# Patient Record
Sex: Female | Born: 1980 | Race: White | Hispanic: Yes | Marital: Single
Health system: Southern US, Community
[De-identification: ages and names within clinical notes are randomized; demographics above are authoritative.]

## PROBLEM LIST (undated history)

## (undated) ENCOUNTER — Inpatient Hospital Stay (HOSPITAL_COMMUNITY): Payer: Self-pay

## (undated) DIAGNOSIS — F329 Major depressive disorder, single episode, unspecified: Secondary | ICD-10-CM

## (undated) DIAGNOSIS — Z789 Other specified health status: Secondary | ICD-10-CM

## (undated) DIAGNOSIS — O0930 Supervision of pregnancy with insufficient antenatal care, unspecified trimester: Secondary | ICD-10-CM

## (undated) DIAGNOSIS — F32A Depression, unspecified: Secondary | ICD-10-CM

## (undated) DIAGNOSIS — S0991XA Unspecified injury of ear, initial encounter: Secondary | ICD-10-CM

## (undated) DIAGNOSIS — Z758 Other problems related to medical facilities and other health care: Secondary | ICD-10-CM

## (undated) DIAGNOSIS — Z8619 Personal history of other infectious and parasitic diseases: Secondary | ICD-10-CM

## (undated) DIAGNOSIS — R87629 Unspecified abnormal cytological findings in specimens from vagina: Secondary | ICD-10-CM

## (undated) DIAGNOSIS — K219 Gastro-esophageal reflux disease without esophagitis: Secondary | ICD-10-CM

## (undated) DIAGNOSIS — E669 Obesity, unspecified: Secondary | ICD-10-CM

## (undated) DIAGNOSIS — D649 Anemia, unspecified: Secondary | ICD-10-CM

## (undated) DIAGNOSIS — Z9289 Personal history of other medical treatment: Secondary | ICD-10-CM

## (undated) HISTORY — PX: GYNECOLOGIC CRYOSURGERY: SHX857

## (undated) HISTORY — DX: Personal history of other infectious and parasitic diseases: Z86.19

## (undated) HISTORY — DX: Unspecified injury of ear, initial encounter: S09.91XA

## (undated) HISTORY — DX: Gastro-esophageal reflux disease without esophagitis: K21.9

## (undated) HISTORY — PX: APPENDECTOMY: SHX54

## (undated) HISTORY — DX: Anemia, unspecified: D64.9

## (undated) HISTORY — DX: Supervision of pregnancy with insufficient antenatal care, unspecified trimester: O09.30

## (undated) HISTORY — DX: Other problems related to medical facilities and other health care: Z75.8

## (undated) HISTORY — DX: Other specified health status: Z78.9

## (undated) HISTORY — DX: Personal history of other medical treatment: Z92.89

## (undated) HISTORY — DX: Unspecified abnormal cytological findings in specimens from vagina: R87.629

## (undated) HISTORY — DX: Obesity, unspecified: E66.9

---

## 2001-08-22 HISTORY — PX: OTHER SURGICAL HISTORY: SHX169

## 2002-08-22 HISTORY — PX: COLPOSCOPY: SHX161

## 2005-08-22 DIAGNOSIS — Z9289 Personal history of other medical treatment: Secondary | ICD-10-CM

## 2005-08-22 HISTORY — DX: Personal history of other medical treatment: Z92.89

## 2007-09-12 ENCOUNTER — Inpatient Hospital Stay (HOSPITAL_COMMUNITY): Admission: AD | Admit: 2007-09-12 | Discharge: 2007-09-12 | Payer: Self-pay | Admitting: Obstetrics & Gynecology

## 2008-02-29 ENCOUNTER — Emergency Department (HOSPITAL_COMMUNITY): Admission: EM | Admit: 2008-02-29 | Discharge: 2008-02-29 | Payer: Self-pay | Admitting: Emergency Medicine

## 2009-01-06 ENCOUNTER — Emergency Department (HOSPITAL_COMMUNITY): Admission: EM | Admit: 2009-01-06 | Discharge: 2009-01-06 | Payer: Self-pay | Admitting: Emergency Medicine

## 2009-08-26 ENCOUNTER — Emergency Department (HOSPITAL_BASED_OUTPATIENT_CLINIC_OR_DEPARTMENT_OTHER): Admission: EM | Admit: 2009-08-26 | Discharge: 2009-08-26 | Payer: Self-pay | Admitting: Emergency Medicine

## 2010-05-05 ENCOUNTER — Inpatient Hospital Stay (HOSPITAL_COMMUNITY): Admission: AD | Admit: 2010-05-05 | Discharge: 2010-05-05 | Payer: Self-pay | Admitting: Obstetrics & Gynecology

## 2010-05-05 ENCOUNTER — Ambulatory Visit: Payer: Self-pay | Admitting: Nurse Practitioner

## 2010-06-18 ENCOUNTER — Ambulatory Visit (HOSPITAL_COMMUNITY): Admission: RE | Admit: 2010-06-18 | Discharge: 2010-06-18 | Payer: Self-pay | Admitting: Family Medicine

## 2010-09-27 ENCOUNTER — Other Ambulatory Visit: Payer: Self-pay | Admitting: Family Medicine

## 2010-09-27 ENCOUNTER — Inpatient Hospital Stay (HOSPITAL_COMMUNITY): Admission: AD | Admit: 2010-09-27 | Payer: Self-pay | Admitting: Obstetrics & Gynecology

## 2010-09-27 DIAGNOSIS — O480 Post-term pregnancy: Secondary | ICD-10-CM

## 2010-09-29 ENCOUNTER — Inpatient Hospital Stay (HOSPITAL_COMMUNITY)
Admission: AD | Admit: 2010-09-29 | Discharge: 2010-10-02 | DRG: 775 | Disposition: A | Discharge status: Home / Self Care | Payer: Medicaid Other | Source: Ambulatory Visit | Attending: Obstetrics & Gynecology | Admitting: Obstetrics & Gynecology

## 2010-09-29 DIAGNOSIS — O34219 Maternal care for unspecified type scar from previous cesarean delivery: Principal | ICD-10-CM | POA: Diagnosis present

## 2010-09-29 LAB — CBC
Platelets: 268 10*3/uL (ref 150–400)
RBC: 3.73 MIL/uL — ABNORMAL LOW (ref 3.87–5.11)
RDW: 14.1 % (ref 11.5–15.5)
WBC: 7.3 10*3/uL (ref 4.0–10.5)

## 2010-09-29 LAB — RPR: RPR Ser Ql: NONREACTIVE

## 2010-09-30 DIAGNOSIS — O34219 Maternal care for unspecified type scar from previous cesarean delivery: Secondary | ICD-10-CM

## 2010-10-01 ENCOUNTER — Ambulatory Visit (HOSPITAL_COMMUNITY): Payer: Medicaid Other | Attending: Family Medicine

## 2010-11-04 LAB — WET PREP, GENITAL

## 2010-11-04 LAB — URINALYSIS, ROUTINE W REFLEX MICROSCOPIC
Bilirubin Urine: NEGATIVE
Nitrite: NEGATIVE
Protein, ur: NEGATIVE mg/dL
Specific Gravity, Urine: 1.025 (ref 1.005–1.030)
Urobilinogen, UA: 0.2 mg/dL (ref 0.0–1.0)

## 2010-11-04 LAB — CBC
HCT: 29.9 % — ABNORMAL LOW (ref 36.0–46.0)
Hemoglobin: 10.5 g/dL — ABNORMAL LOW (ref 12.0–15.0)
MCHC: 35.1 g/dL (ref 30.0–36.0)
RBC: 3.25 MIL/uL — ABNORMAL LOW (ref 3.87–5.11)

## 2010-11-04 LAB — DIFFERENTIAL
Basophils Relative: 1 % (ref 0–1)
Lymphs Abs: 1.9 10*3/uL (ref 0.7–4.0)
Monocytes Absolute: 0.5 10*3/uL (ref 0.1–1.0)
Monocytes Relative: 6 % (ref 3–12)
Neutro Abs: 5.8 10*3/uL (ref 1.7–7.7)
Neutrophils Relative %: 69 % (ref 43–77)

## 2010-11-04 LAB — ABO/RH: ABO/RH(D): O POS

## 2010-11-04 LAB — GC/CHLAMYDIA PROBE AMP, GENITAL
Chlamydia, DNA Probe: NEGATIVE
GC Probe Amp, Genital: NEGATIVE

## 2010-11-04 LAB — RUBELLA SCREEN: Rubella: <0.2 IU/mL

## 2010-11-04 LAB — HIV ANTIBODY (ROUTINE TESTING W REFLEX): HIV: NONREACTIVE

## 2010-11-04 LAB — RPR: RPR Ser Ql: NONREACTIVE

## 2010-11-07 LAB — DIFFERENTIAL
Basophils Absolute: 0.1 10*3/uL (ref 0.0–0.1)
Basophils Relative: 1 % (ref 0–1)
Eosinophils Relative: 1 % (ref 0–5)
Lymphocytes Relative: 12 % (ref 12–46)
Monocytes Absolute: 0.7 10*3/uL (ref 0.1–1.0)

## 2010-11-07 LAB — COMPREHENSIVE METABOLIC PANEL
ALT: 26 U/L (ref 0–35)
AST: 29 U/L (ref 0–37)
Albumin: 5.4 g/dL — ABNORMAL HIGH (ref 3.5–5.2)
Alkaline Phosphatase: 72 U/L (ref 39–117)
Sodium: 143 mEq/L (ref 135–145)
Total Bilirubin: 0.8 mg/dL (ref 0.3–1.2)

## 2010-11-07 LAB — CBC
HCT: 47.1 % — ABNORMAL HIGH (ref 36.0–46.0)
Hemoglobin: 16.2 g/dL — ABNORMAL HIGH (ref 12.0–15.0)
MCHC: 34.3 g/dL (ref 30.0–36.0)
MCV: 89.8 fL (ref 78.0–100.0)
RBC: 5.25 MIL/uL — ABNORMAL HIGH (ref 3.87–5.11)
RDW: 12.2 % (ref 11.5–15.5)

## 2010-11-07 LAB — URINALYSIS, ROUTINE W REFLEX MICROSCOPIC
Glucose, UA: NEGATIVE mg/dL
Hgb urine dipstick: NEGATIVE
Protein, ur: NEGATIVE mg/dL

## 2010-11-30 LAB — URINALYSIS, ROUTINE W REFLEX MICROSCOPIC
Ketones, ur: 15 mg/dL — AB
Nitrite: NEGATIVE
Protein, ur: NEGATIVE mg/dL
Urobilinogen, UA: 1 mg/dL (ref 0.0–1.0)

## 2010-11-30 LAB — URINE MICROSCOPIC-ADD ON

## 2010-11-30 LAB — RAPID STREP SCREEN (MED CTR MEBANE ONLY): Streptococcus, Group A Screen (Direct): POSITIVE — AB

## 2010-11-30 LAB — WET PREP, GENITAL: Trich, Wet Prep: NONE SEEN

## 2010-11-30 LAB — GC/CHLAMYDIA PROBE AMP, GENITAL: Chlamydia, DNA Probe: NEGATIVE

## 2011-05-13 LAB — WET PREP, GENITAL
Clue Cells Wet Prep HPF POC: NONE SEEN
Trich, Wet Prep: NONE SEEN

## 2011-05-13 LAB — POCT PREGNANCY, URINE
Operator id: 113551
Preg Test, Ur: POSITIVE

## 2011-05-13 LAB — URINALYSIS, ROUTINE W REFLEX MICROSCOPIC
Bilirubin Urine: NEGATIVE
Hgb urine dipstick: NEGATIVE
Ketones, ur: NEGATIVE
Specific Gravity, Urine: 1.015
Urobilinogen, UA: 0.2

## 2011-05-13 LAB — CBC
HCT: 31.6 — ABNORMAL LOW
Hemoglobin: 11 — ABNORMAL LOW
MCHC: 34.7
MCV: 88.8
Platelets: 333
RBC: 3.57 — ABNORMAL LOW
RDW: 12.9
WBC: 8.2

## 2011-05-13 LAB — GC/CHLAMYDIA PROBE AMP, GENITAL
Chlamydia, DNA Probe: NEGATIVE
GC Probe Amp, Genital: NEGATIVE

## 2011-05-17 ENCOUNTER — Inpatient Hospital Stay (HOSPITAL_COMMUNITY)
Admission: AD | Admit: 2011-05-17 | Discharge: 2011-05-17 | Disposition: A | Discharge status: Home / Self Care | Payer: Self-pay | Source: Ambulatory Visit | Attending: Obstetrics & Gynecology | Admitting: Obstetrics & Gynecology

## 2011-05-17 ENCOUNTER — Encounter (HOSPITAL_COMMUNITY): Payer: Self-pay | Admitting: *Deleted

## 2011-05-17 DIAGNOSIS — O368190 Decreased fetal movements, unspecified trimester, not applicable or unspecified: Secondary | ICD-10-CM | POA: Insufficient documentation

## 2011-05-17 NOTE — Progress Notes (Signed)
Pt states she was sent up from clinic to hear heart beat due to no fetal movement.  Has appt scheduled Oct. 10th in low risk clinic.  Denies any bleeding, discharge, or leaking of fluid.  Has not started feeling baby move.

## 2011-05-17 NOTE — ED Provider Notes (Signed)
History   Pt presents today c/o no fetal movement. Pt is currently 19.5wks. She states she called the OB clinic today to schedule an appt and when they found out she was not feeling fetal movement, she was told to come to the MAU. She denies vag dc, bleeding, pain, or any other sx at this time.  No chief complaint on file.  HPI  OB History    Grav Para Term Preterm Abortions TAB SAB Ect Mult Living   1               No past medical history on file.  No past surgical history on file.  No family history on file.  History  Substance Use Topics  . Smoking status: Not on file  . Smokeless tobacco: Not on file  . Alcohol Use: Not on file    Allergies: Allergies not on file  No prescriptions prior to admission    Review of Systems  Constitutional: Negative for fever.  Cardiovascular: Negative for chest pain.  Gastrointestinal: Negative for nausea, vomiting, abdominal pain, diarrhea and constipation.  Genitourinary: Negative for dysuria, urgency, frequency and hematuria.  Neurological: Negative for dizziness and headaches.  Psychiatric/Behavioral: Negative for depression and suicidal ideas.   Physical Exam   Blood pressure 111/65, pulse 73, resp. rate 18, height 5\' 3"  (1.6 m), weight 159 lb 9.6 oz (72.394 kg).  Physical Exam  Constitutional: She is oriented to person, place, and time. She appears well-developed and well-nourished. No distress.  HENT:  Head: Normocephalic and atraumatic.  Eyes: EOM are normal. Pupils are equal, round, and reactive to light.  GI: Soft. She exhibits no distension. There is no tenderness. There is no rebound and no guarding.  Genitourinary: No bleeding around the vagina. No vaginal discharge found.  Neurological: She is alert and oriented to person, place, and time.  Skin: Skin is warm and dry. She is not diaphoretic.  Psychiatric: She has a normal mood and affect. Her behavior is normal. Judgment and thought content normal.    MAU Course    Procedures  FHTs 160s.  Assessment and Plan  Decreased fetal movement: discussed with pt at length. She understands that she is not expected to feel much movement if any at 19.5wks. She has appt scheduled. Discussed diet, activity, risks, and precautions.  Clinton Gallant. Rice III, DrHSc, MPAS, PA-C  05/17/2011, 3:33 PM   Henrietta Hoover, PA 05/17/11 1536

## 2011-05-17 NOTE — ED Provider Notes (Signed)
Attestation of Attending Supervision of Advanced Practitioner: Evaluation and management procedures were performed by the PA/NP/CNM/OB Fellow under my supervision/collaboration. Chart reviewed and agree with management and plan.  Fedra Lanter A 05/17/2011 7:53 PM

## 2011-05-19 LAB — URINALYSIS, ROUTINE W REFLEX MICROSCOPIC
Bilirubin Urine: NEGATIVE
Hgb urine dipstick: NEGATIVE
Protein, ur: NEGATIVE
Urobilinogen, UA: 1

## 2011-05-19 LAB — WET PREP, GENITAL: Trich, Wet Prep: NONE SEEN

## 2011-05-19 LAB — CBC
HCT: 39.7
Hemoglobin: 13.2
WBC: 10

## 2011-05-19 LAB — DIFFERENTIAL
Eosinophils Relative: 2
Lymphocytes Relative: 27
Lymphs Abs: 2.7
Monocytes Absolute: 0.7

## 2011-05-19 LAB — POCT PREGNANCY, URINE
Operator id: 161631
Preg Test, Ur: NEGATIVE

## 2011-05-19 LAB — BASIC METABOLIC PANEL
GFR calc Af Amer: >60
GFR calc non Af Amer: >60
Potassium: 3.7
Sodium: 137

## 2011-05-19 LAB — GC/CHLAMYDIA PROBE AMP, GENITAL: GC Probe Amp, Genital: NEGATIVE

## 2011-06-01 ENCOUNTER — Ambulatory Visit (INDEPENDENT_AMBULATORY_CARE_PROVIDER_SITE_OTHER): Payer: Self-pay | Admitting: Obstetrics and Gynecology

## 2011-06-01 DIAGNOSIS — K8020 Calculus of gallbladder without cholecystitis without obstruction: Secondary | ICD-10-CM | POA: Insufficient documentation

## 2011-06-01 DIAGNOSIS — Z23 Encounter for immunization: Secondary | ICD-10-CM

## 2011-06-01 DIAGNOSIS — Z3490 Encounter for supervision of normal pregnancy, unspecified, unspecified trimester: Secondary | ICD-10-CM

## 2011-06-01 DIAGNOSIS — Z3480 Encounter for supervision of other normal pregnancy, unspecified trimester: Secondary | ICD-10-CM

## 2011-06-01 LAB — POCT URINALYSIS DIP (DEVICE)
Glucose, UA: NEGATIVE mg/dL
Hgb urine dipstick: NEGATIVE
Specific Gravity, Urine: 1.025 (ref 1.005–1.030)
Urobilinogen, UA: 0.2 mg/dL (ref 0.0–1.0)

## 2011-06-01 MED ORDER — INFLUENZA VIRUS VACC SPLIT PF IM SUSP: 0.5000 mL | Freq: Once | INTRAMUSCULAR | Status: DC

## 2011-06-01 NOTE — Progress Notes (Signed)
Patient is 30 year old Spanish-speaking Hispanic female gravida 3 para 200 to. Her first baby was born by cesarean section for fetal distress. Her successful VBAC with her second baby in desires the same with this will. She presents at the first time at 21 weeks 4 days gestation. The only complaint she has of significance is bilateral numbness in her arms and hands when she drives a car or sleeps. In her hands she complains of swollen. Her pregnancy thus far has been uneventful. She's been taking prenatal vitamins for several months. Her last Pap smear was normal 2-3 years ago. Her youngest baby is 27 months old.  Examination: HEENT within normal limits. Neck supple with normal size symmetrical thyroid. Lungs are clear to auscultation and percussion. Heart no murmur normal sinus rhythm. Abdomen soft with fundus palpable 22 cm above the symphysis pubis. No tenderness guarding or rebound. No significant upper right quadrant pain. Extremities: No edema, no varicosities, DTRs within normal limits. Pelvic: External genitalia normal. Introitus marital, BUS within normal limits. Vagina clean and well rugated with no significant discharge. Cervix long closed and well epithelialized. Pap smear was taken will also be checked for GC and Chlamydia.  Impression normal mid trimester pregnancy.  Plan: Prenatal profile. Flu shot. Return in 4 weeks for routine exam.

## 2011-06-01 NOTE — Progress Notes (Signed)
Edema: trace on hands with c/o numbness Pain: pelvic Pressure: pelvic

## 2011-06-02 LAB — OBSTETRIC PANEL
Antibody Screen: NEGATIVE
Eosinophils Absolute: 0.1 10*3/uL (ref 0.0–0.7)
Eosinophils Relative: 1 % (ref 0–5)
HCT: 35.5 % — ABNORMAL LOW (ref 36.0–46.0)
Hemoglobin: 11.4 g/dL — ABNORMAL LOW (ref 12.0–15.0)
Lymphs Abs: 1.8 10*3/uL (ref 0.7–4.0)
MCH: 29.3 pg (ref 26.0–34.0)
MCV: 91.3 fL (ref 78.0–100.0)
Monocytes Absolute: 0.6 10*3/uL (ref 0.1–1.0)
Monocytes Relative: 7 % (ref 3–12)
Platelets: 310 10*3/uL (ref 150–400)
RBC: 3.89 MIL/uL (ref 3.87–5.11)
Rh Type: POSITIVE

## 2011-06-02 LAB — HIV ANTIBODY (ROUTINE TESTING W REFLEX): HIV: NONREACTIVE

## 2011-06-06 ENCOUNTER — Other Ambulatory Visit: Payer: Self-pay | Admitting: Family Medicine

## 2011-06-06 DIAGNOSIS — Z3689 Encounter for other specified antenatal screening: Secondary | ICD-10-CM

## 2011-06-06 LAB — ANTIBODY SCREEN: Antibody Screen: NEGATIVE

## 2011-06-06 LAB — ABO/RH: RH Type: POSITIVE

## 2011-06-06 LAB — RPR: RPR: NONREACTIVE

## 2011-06-06 LAB — GC/CHLAMYDIA PROBE AMP, GENITAL
Chlamydia: NEGATIVE
Chlamydia: NEGATIVE
Gonorrhea: NEGATIVE

## 2011-06-09 ENCOUNTER — Ambulatory Visit (HOSPITAL_COMMUNITY)
Admission: RE | Admit: 2011-06-09 | Discharge: 2011-06-09 | Disposition: A | Discharge status: Home / Self Care | Payer: Self-pay | Source: Ambulatory Visit | Attending: Family Medicine | Admitting: Family Medicine

## 2011-06-09 DIAGNOSIS — Z3689 Encounter for other specified antenatal screening: Secondary | ICD-10-CM

## 2011-06-09 DIAGNOSIS — Z1389 Encounter for screening for other disorder: Secondary | ICD-10-CM | POA: Insufficient documentation

## 2011-06-09 DIAGNOSIS — Z363 Encounter for antenatal screening for malformations: Secondary | ICD-10-CM | POA: Insufficient documentation

## 2011-06-09 DIAGNOSIS — O358XX0 Maternal care for other (suspected) fetal abnormality and damage, not applicable or unspecified: Secondary | ICD-10-CM | POA: Insufficient documentation

## 2011-07-13 ENCOUNTER — Encounter (HOSPITAL_COMMUNITY): Payer: Self-pay

## 2011-07-13 ENCOUNTER — Emergency Department (HOSPITAL_COMMUNITY)
Admission: EM | Admit: 2011-07-13 | Discharge: 2011-07-13 | Disposition: A | Discharge status: Home / Self Care | Payer: Medicaid Other | Attending: Emergency Medicine | Admitting: Emergency Medicine

## 2011-07-13 DIAGNOSIS — O99891 Other specified diseases and conditions complicating pregnancy: Secondary | ICD-10-CM | POA: Insufficient documentation

## 2011-07-13 DIAGNOSIS — R1110 Vomiting, unspecified: Secondary | ICD-10-CM | POA: Insufficient documentation

## 2011-07-13 DIAGNOSIS — R112 Nausea with vomiting, unspecified: Secondary | ICD-10-CM | POA: Insufficient documentation

## 2011-07-13 DIAGNOSIS — R109 Unspecified abdominal pain: Secondary | ICD-10-CM | POA: Insufficient documentation

## 2011-07-13 DIAGNOSIS — R8271 Bacteriuria: Secondary | ICD-10-CM

## 2011-07-13 DIAGNOSIS — J45909 Unspecified asthma, uncomplicated: Secondary | ICD-10-CM | POA: Insufficient documentation

## 2011-07-13 DIAGNOSIS — Z79899 Other long term (current) drug therapy: Secondary | ICD-10-CM | POA: Insufficient documentation

## 2011-07-13 DIAGNOSIS — R10813 Right lower quadrant abdominal tenderness: Secondary | ICD-10-CM | POA: Insufficient documentation

## 2011-07-13 LAB — URINALYSIS, ROUTINE W REFLEX MICROSCOPIC
Hgb urine dipstick: NEGATIVE
Ketones, ur: NEGATIVE mg/dL
Protein, ur: NEGATIVE mg/dL
Urobilinogen, UA: 1 mg/dL (ref 0.0–1.0)

## 2011-07-13 LAB — URINE MICROSCOPIC-ADD ON

## 2011-07-13 MED ORDER — NITROFURANTOIN MACROCRYSTAL 100 MG PO CAPS: 100.0000 mg | ORAL_CAPSULE | Freq: Two times a day (BID) | ORAL | Status: AC

## 2011-07-13 MED ORDER — NITROFURANTOIN MACROCRYSTAL 100 MG PO CAPS
100.0000 mg | ORAL_CAPSULE | Freq: Four times a day (QID) | ORAL | Status: DC
  Administered 2011-07-13: 100 mg via ORAL
  Filled 2011-07-13 (×4): qty 1

## 2011-07-13 NOTE — ED Notes (Signed)
Pt presents with vomiting and abdominal pain x 2 days.  Pt reports symptoms began yesterday after biting into a biscuit at [employer] and finding a tooth.  Pt denies the tooth is hers, denies any dental pain.  Pt is [redacted] weeks pregnant.

## 2011-07-13 NOTE — ED Notes (Signed)
Patient states one day ago eating and found a tooth while chewing.  Spit out food then felt nausea and emesis and epigastric pain radiating to ruq and rlq.  Today pain achy 3/10. States 27 months pregnant and feels baby moving.  Patient airway intact bilateral equal chest rise and fall. No apparent distress noted.

## 2011-07-14 NOTE — ED Provider Notes (Signed)
History     CSN: [id number] Arrival date & time: 07/13/2011 12:02 PM   First MD Initiated Contact with Patient 07/13/11 1314      Chief Complaint  Patient presents with  . Emesis    (Consider location/radiation/quality/duration/timing/severity/associated sxs/prior treatment) HPI Patient is a 30 yo G3P2 F at [redacted] weeks gestation who presents with complaint of RLQ, RUQ abdominal pain aas well as nausea and vomiting.  She has had no fevers, urinary symptoms, constipation ,diarrhea, vaginal discharge of any sort.  Patient feels the baby moving and has noted no change with this.  She was seen by her OB yesterday.  She had some of these symptoms at that time and was told she was fine.  She had a UA there but does not know what the results were.  She has already had an appendectomy.  The patient is mainly here because she ate a biscuit yesterday at [employer] and reported that she found a tooth in her biscuit.  She did not lose a tooth.  When she told the restaurant staff they stated that no one there had lost a tooth and were otherwise unconcerned.  PAtient's boyfriend wanted her to get checked out for this. Past Medical History  Diagnosis Date  . No pertinent past medical history   . Asthma     Past Surgical History  Procedure Date  . Cesarean section   . Appendectomy     History reviewed. No pertinent family history.  History  Substance Use Topics  . Smoking status: Never Smoker   . Smokeless tobacco: Not on file  . Alcohol Use: No    OB History    Grav Para Term Preterm Abortions TAB SAB Ect Mult Living   3 2 2       2       Review of Systems  Unable to perform ROS Constitutional: Negative.   HENT: Negative.   Eyes: Negative.   Respiratory: Negative.   Cardiovascular: Negative.   Gastrointestinal:       See HPI  Genitourinary: Negative.   Musculoskeletal: Negative.   Neurological: Negative.   Hematological: Negative.   Psychiatric/Behavioral: Negative.   All other  systems reviewed and are negative.    Allergies  Review of patient's allergies indicates no known allergies.  Home Medications   Current Outpatient Rx  Name Route Sig Dispense Refill  . CALCIUM CARBONATE ANTACID 500 MG PO CHEW Oral Chew 2 tablets by mouth 3 (three) times daily. indigestion     . PRENATAL 27-0.8 MG PO TABS Oral Take 1 tablet by mouth daily.      Marland Kitchen NITROFURANTOIN MACROCRYSTAL 100 MG PO CAPS Oral Take 1 capsule (100 mg total) by mouth 2 (two) times daily. 20 capsule 0    BP 111/62  Pulse 79  Temp(Src) 98 F (36.7 C) (Oral)  Resp 20  Ht 5\' 3"  (1.6 m)  Wt 166 lb (75.297 kg)  BMI 29.41 kg/m2  SpO2 97%  Physical Exam  Nursing note and vitals reviewed. Constitutional: She is oriented to person, place, and time. She appears well-developed and well-nourished. No distress.  HENT:  Head: Normocephalic and atraumatic.  Eyes: Conjunctivae and EOM are normal. Pupils are equal, round, and reactive to light.  Neck: Normal range of motion.  Cardiovascular: Normal rate, regular rhythm, normal heart sounds and intact distal pulses.  Exam reveals no gallop and no friction rub.   No murmur heard. Pulmonary/Chest: Effort normal and breath sounds normal. No respiratory distress. She has  no wheezes. She has no rales.  Abdominal: Soft. Bowel sounds are normal. There is no rebound and no guarding.       Gravid and c/w dates, minimal TTP on deep palpation in the RLQ. No TTP elsewhere despite patient report.  Genitourinary:       Pelvic exam deferred in stretcher triage, FHT's checked and in the 150s, Patient denied any vaginal discharge.  Musculoskeletal: Normal range of motion.  Neurological: She is alert and oriented to person, place, and time. No cranial nerve deficit. She exhibits normal muscle tone. Coordination normal.  Skin: Skin is warm and dry. No rash noted.  Psychiatric: She has a normal mood and affect.    ED Course  Procedures (including critical care time)  Labs  Reviewed  URINALYSIS, ROUTINE W REFLEX MICROSCOPIC - Abnormal; Notable for the following:    Leukocytes, UA SMALL (*)    All other components within normal limits  URINE MICROSCOPIC-ADD ON - Abnormal; Notable for the following:    Squamous Epithelial / LPF FEW (*)    Bacteria, UA FEW (*)    All other components within normal limits  URINE CULTURE   No results found.   1. Bacteriuria, asymptomatic in pregnancy       MDM  Patient was very well appearing and had only minimal TTP over the RLQ.  She was mainly concerned about the possible ingestion of a tooth.  I explained that if this had occurred it should just pass in her stool.  There was no concern for aspiration of this,  She did have mild TTP in the RLQ but she no longer has an appendix.  She also has no vaginal discharge at all and was seen by her OB yesterday.  Baby's movement has been normal and FHT were WNL.  Patient had UA with asymptomatic bacteriuria and was given macrodantin for this.  She was discharged in good condition.        Cyndra Numbers, MD 07/14/11 1009

## 2011-07-15 LAB — URINE CULTURE: Colony Count: >=100000

## 2011-07-16 NOTE — ED Notes (Signed)
+   urine culture. Treated with Macrodantin, sensitive to same per protocol MD.

## 2011-07-27 LAB — STREP B DNA PROBE: GBS: POSITIVE

## 2011-07-28 LAB — STREP B DNA PROBE: GBS: POSITIVE

## 2011-08-23 NOTE — L&D Delivery Note (Signed)
Delivery Note At 7:22 PM a viable female was delivered via Vaginal, Spontaneous Delivery (Presentation: ;ROA  ).  APGAR: 9, 9; weight 7 lb 0.7 oz (3195 g).   Placenta status: Intact, Spontaneous.  Cord: 3 vessels with the following complications: None.   Anesthesia: None  Episiotomy: None Lacerations: None Suture Repair: n/a Est. Blood Loss (mL):   Mom to postpartum.  Baby to nursery-stable.  CRESENZO-DISHMAN,Dhanya Bogle 10/12/2011, 8:22 PM

## 2011-10-06 ENCOUNTER — Other Ambulatory Visit: Payer: Self-pay | Admitting: Family Medicine

## 2011-10-06 DIAGNOSIS — O480 Post-term pregnancy: Secondary | ICD-10-CM

## 2011-10-07 ENCOUNTER — Telehealth (HOSPITAL_COMMUNITY): Payer: Self-pay | Admitting: *Deleted

## 2011-10-07 ENCOUNTER — Encounter (HOSPITAL_COMMUNITY): Payer: Self-pay | Admitting: *Deleted

## 2011-10-07 NOTE — Telephone Encounter (Signed)
Preadmission screen  

## 2011-10-10 ENCOUNTER — Other Ambulatory Visit (HOSPITAL_COMMUNITY): Payer: Self-pay | Admitting: Physician Assistant

## 2011-10-10 ENCOUNTER — Ambulatory Visit (HOSPITAL_COMMUNITY): Admission: RE | Admit: 2011-10-10 | Payer: Self-pay | Source: Ambulatory Visit

## 2011-10-10 ENCOUNTER — Encounter (HOSPITAL_COMMUNITY): Payer: Self-pay | Admitting: *Deleted

## 2011-10-10 ENCOUNTER — Ambulatory Visit (HOSPITAL_COMMUNITY)
Admission: RE | Admit: 2011-10-10 | Discharge: 2011-10-10 | Disposition: A | Discharge status: Home / Self Care | Payer: Self-pay | Source: Ambulatory Visit | Attending: Physician Assistant | Admitting: Physician Assistant

## 2011-10-10 DIAGNOSIS — O480 Post-term pregnancy: Secondary | ICD-10-CM

## 2011-10-10 DIAGNOSIS — Z3689 Encounter for other specified antenatal screening: Secondary | ICD-10-CM | POA: Insufficient documentation

## 2011-10-10 NOTE — Telephone Encounter (Signed)
[id number] 

## 2011-10-12 ENCOUNTER — Inpatient Hospital Stay (HOSPITAL_COMMUNITY)
Admission: AD | Admit: 2011-10-12 | Discharge: 2011-10-14 | DRG: 775 | Disposition: A | Discharge status: Home / Self Care | Payer: Medicaid Other | Source: Ambulatory Visit | Attending: Family Medicine | Admitting: Family Medicine

## 2011-10-12 ENCOUNTER — Encounter (HOSPITAL_COMMUNITY): Payer: Self-pay

## 2011-10-12 DIAGNOSIS — O9989 Other specified diseases and conditions complicating pregnancy, childbirth and the puerperium: Secondary | ICD-10-CM

## 2011-10-12 DIAGNOSIS — Z22330 Carrier of Group B streptococcus: Secondary | ICD-10-CM

## 2011-10-12 DIAGNOSIS — O99892 Other specified diseases and conditions complicating childbirth: Principal | ICD-10-CM | POA: Diagnosis present

## 2011-10-12 HISTORY — DX: Depression, unspecified: F32.A

## 2011-10-12 HISTORY — DX: Major depressive disorder, single episode, unspecified: F32.9

## 2011-10-12 LAB — CBC
HCT: 31 % — ABNORMAL LOW (ref 36.0–46.0)
MCH: 27 pg (ref 26.0–34.0)
MCV: 83.6 fL (ref 78.0–100.0)
RBC: 3.71 MIL/uL — ABNORMAL LOW (ref 3.87–5.11)
WBC: 12.1 10*3/uL — ABNORMAL HIGH (ref 4.0–10.5)

## 2011-10-12 LAB — GC/CHLAMYDIA PROBE AMP, GENITAL
Chlamydia: NEGATIVE
Gonorrhea: NEGATIVE

## 2011-10-12 LAB — ABO/RH: ABO/RH(D): O POS

## 2011-10-12 MED ORDER — OXYCODONE-ACETAMINOPHEN 5-325 MG PO TABS
1.0000 | ORAL_TABLET | ORAL | Status: DC | PRN
  Administered 2011-10-12 (×2): 1 via ORAL
  Filled 2011-10-12 (×2): qty 1

## 2011-10-12 MED ORDER — IBUPROFEN 600 MG PO TABS
600.0000 mg | ORAL_TABLET | Freq: Four times a day (QID) | ORAL | Status: DC
  Administered 2011-10-13 – 2011-10-14 (×8): 600 mg via ORAL
  Filled 2011-10-12 (×8): qty 1

## 2011-10-12 MED ORDER — WITCH HAZEL-GLYCERIN EX PADS: 1.0000 "application " | MEDICATED_PAD | CUTANEOUS | Status: DC | PRN

## 2011-10-12 MED ORDER — PENICILLIN G POTASSIUM 5000000 UNITS IJ SOLR
2.5000 10*6.[IU] | INTRAVENOUS | Status: DC
  Filled 2011-10-12 (×3): qty 2.5

## 2011-10-12 MED ORDER — BISACODYL 10 MG RE SUPP: 10.0000 mg | Freq: Every day | RECTAL | Status: DC | PRN

## 2011-10-12 MED ORDER — SENNOSIDES-DOCUSATE SODIUM 8.6-50 MG PO TABS
2.0000 | ORAL_TABLET | Freq: Every day | ORAL | Status: DC
  Administered 2011-10-13 (×2): 2 via ORAL

## 2011-10-12 MED ORDER — ONDANSETRON HCL 4 MG/2ML IJ SOLN: 4.0000 mg | INTRAMUSCULAR | Status: DC | PRN

## 2011-10-12 MED ORDER — LACTATED RINGERS IV SOLN: 500.0000 mL | INTRAVENOUS | Status: DC | PRN

## 2011-10-12 MED ORDER — OXYTOCIN BOLUS FROM INFUSION
500.0000 mL | Freq: Once | INTRAVENOUS | Status: AC
  Administered 2011-10-12: 500 mL via INTRAVENOUS
  Filled 2011-10-12: qty 1000
  Filled 2011-10-12: qty 500

## 2011-10-12 MED ORDER — ONDANSETRON HCL 4 MG/2ML IJ SOLN: 4.0000 mg | Freq: Four times a day (QID) | INTRAMUSCULAR | Status: DC | PRN

## 2011-10-12 MED ORDER — CITRIC ACID-SODIUM CITRATE 334-500 MG/5ML PO SOLN: 30.0000 mL | ORAL | Status: DC | PRN

## 2011-10-12 MED ORDER — NALBUPHINE SYRINGE 5 MG/0.5 ML
5.0000 mg | INJECTION | INTRAMUSCULAR | Status: DC | PRN
  Administered 2011-10-12: 5 mg via INTRAVENOUS
  Filled 2011-10-12 (×2): qty 0.5

## 2011-10-12 MED ORDER — METHYLERGONOVINE MALEATE 0.2 MG/ML IJ SOLN: 0.2000 mg | INTRAMUSCULAR | Status: DC | PRN

## 2011-10-12 MED ORDER — DIPHENHYDRAMINE HCL 25 MG PO CAPS: 25.0000 mg | ORAL_CAPSULE | Freq: Four times a day (QID) | ORAL | Status: DC | PRN

## 2011-10-12 MED ORDER — METHYLERGONOVINE MALEATE 0.2 MG PO TABS: 0.2000 mg | ORAL_TABLET | ORAL | Status: DC | PRN

## 2011-10-12 MED ORDER — TETANUS-DIPHTH-ACELL PERTUSSIS 5-2.5-18.5 LF-MCG/0.5 IM SUSP
0.5000 mL | Freq: Once | INTRAMUSCULAR | Status: AC
  Administered 2011-10-13: 0.5 mL via INTRAMUSCULAR
  Filled 2011-10-12: qty 0.5

## 2011-10-12 MED ORDER — PENICILLIN G POTASSIUM 5000000 UNITS IJ SOLR
5.0000 10*6.[IU] | Freq: Once | INTRAVENOUS | Status: DC
  Filled 2011-10-12: qty 5

## 2011-10-12 MED ORDER — FLEET ENEMA 7-19 GM/118ML RE ENEM: 1.0000 | ENEMA | Freq: Every day | RECTAL | Status: DC | PRN

## 2011-10-12 MED ORDER — PRENATAL MULTIVITAMIN CH
1.0000 | ORAL_TABLET | Freq: Every day | ORAL | Status: DC
  Administered 2011-10-13 – 2011-10-14 (×2): 1 via ORAL
  Filled 2011-10-12 (×2): qty 1

## 2011-10-12 MED ORDER — IBUPROFEN 600 MG PO TABS: 600.0000 mg | ORAL_TABLET | Freq: Four times a day (QID) | ORAL | Status: DC | PRN

## 2011-10-12 MED ORDER — BENZOCAINE-MENTHOL 20-0.5 % EX AERO: 1.0000 "application " | INHALATION_SPRAY | CUTANEOUS | Status: DC | PRN

## 2011-10-12 MED ORDER — LIDOCAINE HCL (PF) 1 % IJ SOLN
30.0000 mL | INTRAMUSCULAR | Status: DC | PRN
  Filled 2011-10-12: qty 30

## 2011-10-12 MED ORDER — LACTATED RINGERS IV SOLN
INTRAVENOUS | Status: DC
  Administered 2011-10-12: 17:00:00 via INTRAVENOUS

## 2011-10-12 MED ORDER — ACETAMINOPHEN 325 MG PO TABS: 650.0000 mg | ORAL_TABLET | ORAL | Status: DC | PRN

## 2011-10-12 MED ORDER — FLEET ENEMA 7-19 GM/118ML RE ENEM: 1.0000 | ENEMA | RECTAL | Status: DC | PRN

## 2011-10-12 MED ORDER — OXYCODONE-ACETAMINOPHEN 5-325 MG PO TABS
1.0000 | ORAL_TABLET | ORAL | Status: DC | PRN
  Administered 2011-10-13 (×2): 1 via ORAL
  Filled 2011-10-12 (×2): qty 1

## 2011-10-12 MED ORDER — LANOLIN HYDROUS EX OINT: TOPICAL_OINTMENT | CUTANEOUS | Status: DC | PRN

## 2011-10-12 MED ORDER — ZOLPIDEM TARTRATE 5 MG PO TABS: 5.0000 mg | ORAL_TABLET | Freq: Every evening | ORAL | Status: DC | PRN

## 2011-10-12 MED ORDER — PENICILLIN G POTASSIUM 5000000 UNITS IJ SOLR
5.0000 10*6.[IU] | Freq: Once | INTRAVENOUS | Status: AC
  Administered 2011-10-12: 5 10*6.[IU] via INTRAVENOUS
  Filled 2011-10-12: qty 5

## 2011-10-12 MED ORDER — SIMETHICONE 80 MG PO CHEW: 80.0000 mg | CHEWABLE_TABLET | ORAL | Status: DC | PRN

## 2011-10-12 MED ORDER — OXYTOCIN 20 UNITS IN LACTATED RINGERS INFUSION - SIMPLE
125.0000 mL/h | Freq: Once | INTRAVENOUS | Status: AC
  Administered 2011-10-12: 125 mL/h via INTRAVENOUS

## 2011-10-12 MED ORDER — NALBUPHINE SYRINGE 5 MG/0.5 ML
5.0000 mg | INJECTION | Freq: Once | INTRAMUSCULAR | Status: AC
  Administered 2011-10-12: 5 mg via INTRAVENOUS
  Filled 2011-10-12: qty 0.5

## 2011-10-12 MED ORDER — DIBUCAINE 1 % RE OINT: 1.0000 "application " | TOPICAL_OINTMENT | RECTAL | Status: DC | PRN

## 2011-10-12 MED ORDER — ONDANSETRON HCL 4 MG PO TABS: 4.0000 mg | ORAL_TABLET | ORAL | Status: DC | PRN

## 2011-10-12 MED ORDER — DEXTROSE 5 % IV SOLN
2.5000 10*6.[IU] | INTRAVENOUS | Status: DC
  Filled 2011-10-12 (×3): qty 2.5

## 2011-10-12 NOTE — H&P (Signed)
  Abigail Rubio is a 31 y.o. female [phone] with IUP at [redacted]w[redacted]d presenting for labor. Pt states she has been having regular, every 2-3 minutes contractions, associated with none vaginal bleeding, membranes are intact, with active fetal movement.   [facility] since 28 wks  Prenatal History/Complications:  Late care; HX C/S with VBAC; GBS +  Past Medical History: Past Medical History  Diagnosis Date  . Asthma 1980  . Abnormal Pap smear 2004    during 1st pregnancy.     Past Surgical History: Past Surgical History  Procedure Date  . Colposcopy 2004  . Apendectomy 2003  . Cesarean section 2004    Obstetrical History: OB History    Grav Para Term Preterm Abortions TAB SAB Ect Mult Living   4 2 2  0 1 0 1 0 0 2      Gynecological History: OB History    Grav Para Term Preterm Abortions TAB SAB Ect Mult Living   4 2 2  0 1 0 1 0 0 2      Social History: History   Social History  . Marital Status: Single    Spouse Name: N/A    Number of Children: N/A  . Years of Education: N/A   Social History Main Topics  . Smoking status: Never Smoker   . Smokeless tobacco: Never Used  . Alcohol Use: No  . Drug Use: No  . Sexually Active: Yes    Birth Control/ Protection: Pill   Other Topics Concern  . None   Social History Narrative  . None    Family History: Family History  Problem Relation Age of Onset  . Kidney disease Sister     Allergies: Allergies  Allergen Reactions  . Pork-Derived Products Nausea And Vomiting    Prescriptions prior to admission  Medication Sig Dispense Refill  . Calcium Carbonate Antacid (TUMS PO) Take 1 tablet by mouth daily as needed. For heartburn      . Prenatal Vit-Fe Fumarate-FA (PRENATAL MULTIVITAMIN) TABS Take 1 tablet by mouth daily.        Review of Systems - Negative except contractions   Blood pressure 137/90, pulse 88, temperature 97.6 F (36.4 C), temperature source Oral, resp. rate 20, last menstrual  period 01/01/2011, not currently breastfeeding. General appearance: alert, cooperative and mild distress Lungs: clear to auscultation bilaterally Heart: regular rate and rhythm Abdomen: soft, non-tender; bowel sounds normal Pelvic: 6/90/-3 Extremities: Homans sign is negative, no sign of DVT DTR's 2+ Presentation: cephalic Fetal monitoringBaseline: 130 bpm, Variability: Good {> 6 bpm), Accelerations: Reactive and Decelerations: Absent Uterine activity:  Strong contractions q 2-3 minutes Prenatal labs: ABO, Rh: O/POS/-- (10/10 1044) Antibody: NEG (10/10 1044) Rubella:  immune RPR: NON REAC (10/10 1044)  HBsAg: NEGATIVE (10/10 1044)  HIV: NON REACTIVE (10/10 1201)  GBS: Positive (12/06 0000)  1 hr Glucola 60 Genetic screening  Too late Anatomy US normal  Assessment: Abigail Rubio is a 31 y.o. [id number] with an IUP at [redacted]w[redacted]d presenting for active labor  Plan: GBS prophylaxis, expectant management   CRESENZO-DISHMAN,Camarie Mctigue 10/12/2011, 7:13 PM

## 2011-10-13 NOTE — Progress Notes (Signed)
UR Chart review completed.  
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
## 2011-10-13 NOTE — Progress Notes (Signed)
Post Partum Day 1 Subjective: no complaints, up ad lib, voiding and tolerating PO  Objective: Blood pressure 103/66, pulse 67, temperature 98 F (36.7 C), temperature source Oral, resp. rate 20, height 5\' 3"  (1.6 m), weight 79.379 kg (175 lb), last menstrual period 01/01/2011, SpO2 98.00%, not currently breastfeeding.  Physical Exam:  General: alert, cooperative, appears stated age and no distress Lochia: appropriate Uterine Fundus: firm Incision: n/a DVT Evaluation: No evidence of DVT seen on physical exam. Negative Homan's sign. No cords or calf tenderness. No significant calf/ankle edema.   Basename 10/12/11 1650  HGB 10.0*  HCT 31.0*    Assessment/Plan: Plan for discharge tomorrow, Breastfeeding, Lactation consult and Contraception Mirena   LOS: 1 day   Nancy Fetter 10/13/2011, 7:47 AM

## 2011-10-14 MED ORDER — IBUPROFEN 600 MG PO TABS: 600.0000 mg | ORAL_TABLET | Freq: Four times a day (QID) | ORAL | Status: AC

## 2011-10-14 NOTE — Progress Notes (Signed)
Post Partum Day 2 Subjective: no complaints, up ad lib, voiding, tolerating PO and + flatus  Objective: Blood pressure 101/66, pulse 60, temperature 97.8 F (36.6 C), temperature source Oral, resp. rate 18, height 5\' 3"  (1.6 m), weight 79.379 kg (175 lb), last menstrual period 01/01/2011, SpO2 98.00%, not currently breastfeeding.  Physical Exam:  General: alert, cooperative and no distress Lochia: appropriate Uterine Fundus: firm Incision: N/A DVT Evaluation: No evidence of DVT seen on physical exam.   Basename 10/12/11 1650  HGB 10.0*  HCT 31.0*    Assessment/Plan: Discharge home today, following d/c of infant.   LOS: 2 days   LEFTWICH-KIRBY, Anoop Hemmer 10/14/2011, 7:04 AM

## 2011-10-14 NOTE — Discharge Instructions (Signed)
Cuidados luego de un parto por va vaginal (Postpartum Care After Vaginal Delivery) Tia Alert del nacimiento del beb deber permanecer en el hospital durante 24 a 72 horas, excepto que hubiera existido algn problema, o usted sufra alguna enfermedad. Mientras se encuentre en el hospital recibir ayuda e instrucciones por parte de las enfermeras y el mdico, quienes cuidarn de usted y su beb y Chief Executive Officer darn consejos para Metallurgist, especialmente si es Financial risk analyst hijo.  En caso de ser necesario, le prescribirn analgsicos. Observar una pequea hemorragia vaginal y deber cambiar los apsitos con frecuencia. Lvese las manos cuidadosamente con agua y jabn durante al menos 20 segundos luego de cambiarse el apsito o ir al bao. Si elimina cogulos o aumenta la hemorragia, infrmelo a la enfermera. No deseche los cogulos sanguneos antes de mostrrselos a la enfermera, para asegurarse de que no es tejido Geologist, engineering. Si le han colocado una va intravenosa, se la retirarn dentro de las 24 horas, si no hay problemas. La primera vez que se levante de la cama o tome una ducha, llame a la enfermera para que la ayude que puede sentirse dbil, mareada o [place]. Si est amamantando, puede sentir contracciones dolorosas en el tero durante algunas semanas. Esto es normal y Medical sales representative, ya que de este modo el tero vuelve a su tamao normal. Si no est amamantando, utilice un sostn de soporte y trate de no tocarse las [employer] que haya dejado de producir Veguita. No deben administrarse hormonas para suprimir la [place], debido a que pueden causar cogulos sanguneos. Podr seguir una dieta normal, excepto que sufra diabetes o presente otros problemas de [place].  La enfermera colocar bolsas con hielo en el sitio de la episiotoma (agrandamiento quirrgico de la apertura vaginal) para reducir Chief Technology Officer y la hinchazn. En algunos casos raros hay dificultad para orinar, entonces la enfermera deber vaciarle la  vejiga con un catter. Si le han practicado una ligadura tubaria durante el posparto ("trompas atadas", esterilizacin femenina), esto no har que permanezca ms [employer] hospital. Podr tener al beb en su habitacin todo el tiempo que lo desee si el beb no tiene ningn problema. Lleve y traiga al beb de la nursery dentro de la [place]. No lo lleve en brazos. No abandone el rea de posparto. Si la madre es Rh negativa (falta de una protena en los glbulos rojos) y el beb es Rh positivo, la madre debe aplicarse la vacuna RhoGam para evitar problemas con el factor Rh en futuros embarazos Le darn instrucciones por escrito para usted y el beb y los medicamentos necesarios cuando reciba el alta mdica. Asegrese que comprende y sigue las indicaciones. INSTRUCCIONES PARA EL CUIDADO DOMICILIARIO  Siga las instrucciones y tome los medicamentos que le indicaron cuando le dieron el alta mdica.   Utilice los medicamentos de venta libre o de prescripcin para Chief Technology Officer, Environmental health practitioner o la [place], segn se lo indique el profesional que lo asiste.   No tome aspirina, ya que puede causar hemorragias.   Aumente sus actividades un poco cada da para tener ms fuerza y Hydrographic surveyor.   No beba alcohol, especialmente si est amamantando o toma analgsicos.   Tmese la [employer] veces por da y Engineering geologist.   Podr tener una pequea hemorragia durante 2 a 4 semanas. Esto es normal.   No utilice tampones o duchas vaginales, use toallas higinicas.   Trate de que Therapist, nutritional con usted y la ayude durante los primeros das en el hogar.  Descanse o duerma una siesta cuando el beb duerma.   Si est amamantando, use un buen sostn. Si no est amamantando, use un buen sostn y no estimule los pezones.   Consuma una dieta sana y siga tomando las vitaminas prenatales.   No conduzca vehculos, no realice actividades pesadas ni viaje hasta que su mdico la autorice.   No mantenga relaciones  sexuales hasta que el mdico lo permita.   Consulte con el profesional cuando puede comenzar a Education officer, environmental actividad fsica y que tipo de Glass blower/designer.   Comunquese inmediatamente con el mdico si tiene problemas luego del [place].   Comunquese con el pediatra si tiene problemas con el beb.   Programe su visita de control luego del parto y cmplala.  SOLICITE ATENCIN MDICA SI:  La temperatura se eleva por encima de 100 F (37.8 C).   Aumenta la hemorragia vaginal o elimina cogulos. Conserve algunos cogulos para mostrrselos al mdico.   Anola Gurney sangre o siente dolor al Geographical information systems officer.   Presenta secrecin vaginal con olor ftido.   Aumenta el dolor o la inflamacin en el sitio de la episiotoma (agrandamiento quirrgico de la apertura vaginal).   Sufre una cefalea grave.   Se siente deprimida.   La incisin se abre.   Se siente mareada o sufre un desmayo.   Aparece una erupcin cutnea.   Tiene una reaccin o problemas con su medicamento.   Siente dolor u observa enrojecimiento e hinchazn en el sitio de la va intravenosa.  SOLICITE ATENCIN MDICA DE INMEDIATO SI:  Siente dolor en el pecho.   Comienza a sentir falta de aire.   Se desmaya.   Siente dolor, con o sin hinchazn e irritacin en la pierna.   Tiene una hemorragia vaginal abundante, con o sin cogulos   IT consultant.   Brett Fairy secrecin vaginal con mal olor.  ASEGURESE QUE:   Comprende estas instrucciones.   Controlar su enfermedad.   Solicitar ayuda de inmediato si no mejora o empeora.  Document Released: 06/05/2007 Document Revised: 04/20/2011 [facility] Patient Information 2012 [place], [place].  Postpartum Care After Vaginal Delivery After you deliver your baby, you will stay in the hospital for 24 to 72 hours, unless there were problems with the labor or delivery, or you have medical problems. While you are in the hospital, you will receive help and instructions on how  to care for yourself and your baby. Your doctor will order pain medicine, in case you need it. You will have a small amount of bleeding from your vagina and should change your sanitary pad frequently. Wash your hands thoroughly with soap and water for at least 20 seconds after changing pads and using the toilet. Let the nurses know if you begin to pass blood clots or your bleeding increases. Do not flush blood clots down the toilet before having the nurse look at them, to make sure there is no placental tissue with them. If you had an intravenous (IV), it will be removed within 24 hours, if there are no problems. The first time you get out of bed or take a shower, call the nurse to help you because you may get weak, lightheaded, or even faint. If you are breastfeeding, you may feel painful contractions of your uterus for a couple of weeks. This is normal. The contractions help your uterus get back to normal size. If you are not breastfeeding, wear a supportive bra and handle your breasts as little as possible until  your milk has dried up. Hormones should not be given to dry up the breasts, because they can cause blood clots. You will be given your normal diet, unless you have diabetes or other medical problems.  The nurses may put an ice pack on your episiotomy (surgically enlarged opening), if you have one, to reduce the pain and swelling. On rare occasions, you may not be able to urinate and the nurse will need to empty your bladder with a catheter. If you had a postpartum tubal ligation ("tying tubes," female sterilization), it should not make your stay in the hospital longer. You may have your baby in your room with you as much as you like, unless you or the baby has a problem. Use the bassinet (basket) for the baby when going to and from the nursery. Do not carry the baby. Do not leave the postpartum area. If the mother is Rh negative (lacks a protein on the red blood cells) and the baby is Rh positive, the  mother should get a Rho-gam shot to prevent Rh problems with future pregnancies. You may be given written instructions for you and your baby, and necessary medicines, when you are discharged from the hospital. Be sure you understand and follow the instructions as advised. HOME CARE INSTRUCTIONS   Follow instructions and take the medicines given to you.   Only take over-the-counter or prescription medicines for pain, discomfort, or fever as directed by your caregiver.   Do not take aspirin, because it can cause bleeding.   Increase your activities a little bit every day to build up your strength and endurance.   Do not drink alcohol, especially if you are breastfeeding or taking pain medicine.   Take your temperature twice a day and record it.   You may have a small amount of bleeding or spotting for 2 to 4 weeks. This is normal.   Do not use tampons or douche. Use sanitary pads.   Try to have someone stay and help you for a few days when you go home.   Try to rest or take a nap when the baby is sleeping.   If you are breastfeeding, wear a good support bra. If you are not breastfeeding, wear a supportive bra and do not stimulate your nipples.   Eat a healthy, nutritious diet and continue to take your prenatal vitamins.   Do not drive, do any heavy activities, or travel until your caregiver tells you it is okay.   Do not have intercourse until your caregiver gives you permission to do so.   Ask your caregiver when you can begin to exercise and what type of exercises to do.   Call your caregiver if you think you are having a problem from your delivery.   Call your pediatrician if you are having a problem with the baby.   Schedule your postpartum visit and keep it.  SEEK MEDICAL CARE IF:   You have a temperature of 100 F (37.8 C) or higher.   You have increased vaginal bleeding or are passing clots. Save any clots to show your caregiver.   You have bloody urine or pain when  you urinate.   You have a bad smelling vaginal discharge.   You have increasing pain or swelling on your episiotomy.   You develop a severe headache.   You feel depressed.   The episiotomy is separating.   You become dizzy or lightheaded.   You develop a rash.   You have  a reaction or problems with your medicine.   You have pain, redness, or swelling at the intravenous site.  SEEK IMMEDIATE MEDICAL CARE IF:   You have chest pain.   You develop shortness of breath.   You pass out.   You develop pain, with or without swelling or redness in your leg.   You develop heavy vaginal bleeding, with or without blood clots.   You develop stomach pain.   You develop a bad smelling vaginal discharge.  MAKE SURE YOU:   Understand these instructions.   Will watch your condition.   Will get help right away if you are not doing well or get worse.  Document Released: 06/05/2007 Document Revised: 04/20/2011 Document Reviewed: 06/17/2009 [facility] Patient Information 2012 [place], [place].

## 2011-10-16 ENCOUNTER — Inpatient Hospital Stay (HOSPITAL_COMMUNITY): Admission: RE | Admit: 2011-10-16 | Payer: Self-pay | Source: Ambulatory Visit

## 2011-10-16 NOTE — H&P (Signed)
Attestation of Attending Supervision of Advanced Practitioner: Evaluation and management procedures were performed by the PA/NP/CNM/OB Fellow under my supervision/collaboration. Chart reviewed and agree with management and plan.  Kasten Leveque V 10/16/2011 8:50 PM

## 2011-10-18 ENCOUNTER — Encounter: Payer: Self-pay | Admitting: Physician Assistant

## 2011-10-22 NOTE — Discharge Summary (Signed)
Obstetric Discharge Summary Reason for Admission: onset of labor Prenatal Procedures: none Intrapartum Procedures: spontaneous vaginal delivery Postpartum Procedures: none Complications-Operative and Postpartum: none Hemoglobin  Date Value Range Status  10/12/2011 10.0* 12.0-15.0 (g/dL) Final     HCT  Date Value Range Status  10/12/2011 31.0* 36.0-46.0 (%) Final    Discharge Diagnoses: Term Pregnancy-delivered  Discharge Information: Date: 10/22/2011 Activity: pelvic rest Diet: routine Medications: Ibuprofen Condition: stable Instructions: refer to practice specific booklet Discharge to: home Follow-up Information    Follow up with [id number] in 5 weeks. (F/u with health department in 4-6 weeks.)    Contact information:   [address] [place] [address] [phone]

## 2011-10-24 NOTE — Discharge Summary (Signed)
Attestation of Attending Supervision of Advanced Practitioner: Evaluation and management procedures were performed by the PA/NP/CNM/OB Fellow under my supervision/collaboration. Chart reviewed, and agree with management and plan.  Jaynie Collins, M.D. 10/24/2011 11:55 AM

## 2011-11-18 ENCOUNTER — Ambulatory Visit: Payer: Self-pay | Admitting: Physician Assistant

## 2011-11-25 ENCOUNTER — Ambulatory Visit: Payer: Self-pay | Admitting: Physician Assistant

## 2012-02-03 IMAGING — US US OB DETAIL+14 WK
2 series · 12 of 28 positions shown · non-contrast
Comparison: none

[Series 1: us ob detail +14 wk · 1 of 5 slices shown (1 of 2)]
[im 5/5]
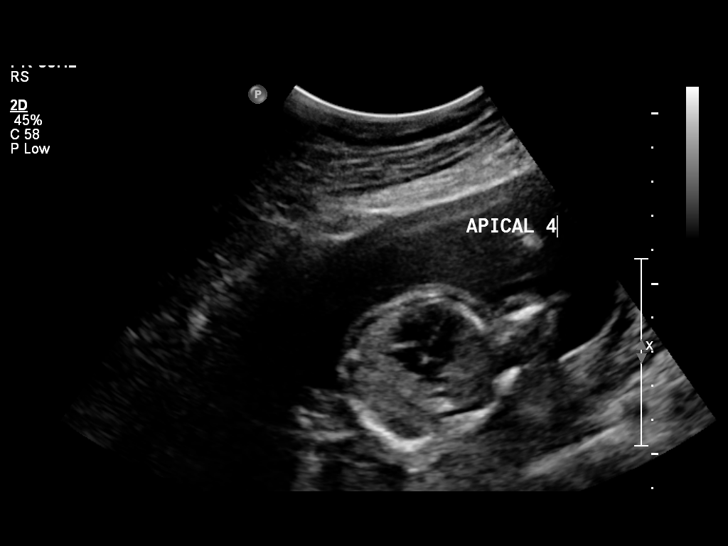

[Series 1: us ob detail +14 wk · 11 of 55 slices shown (2 of 2)]
[im 3/55]
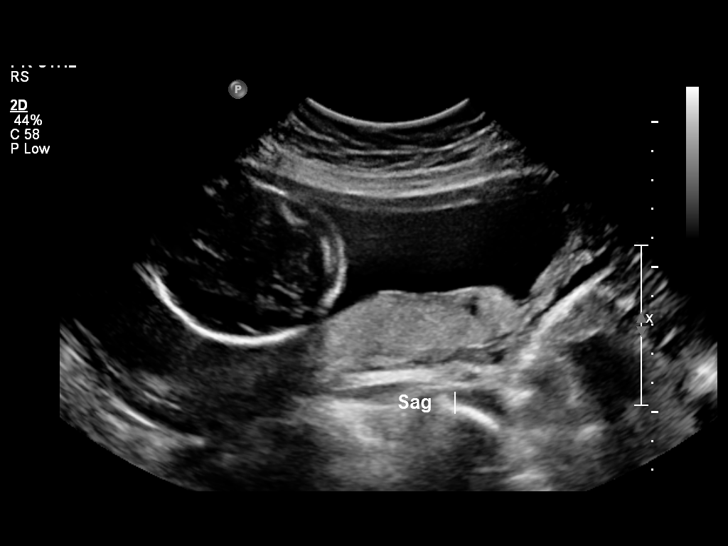
[im 7/55]
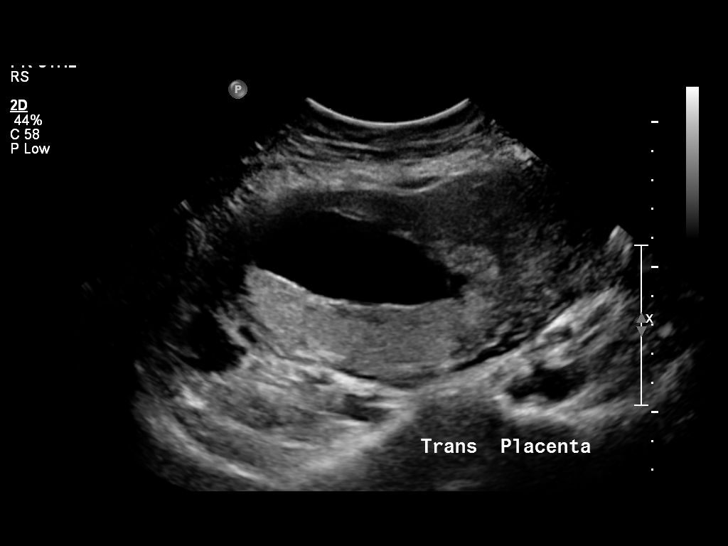
[im 13/55]
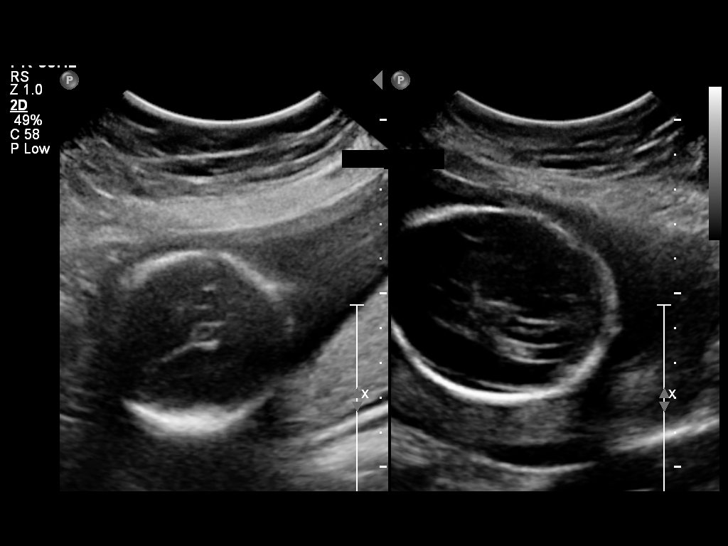
[im 18/55]
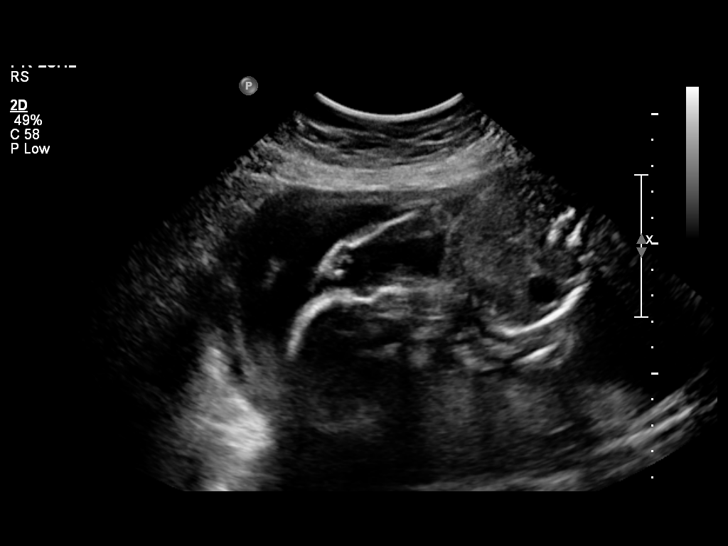
[im 22/55]
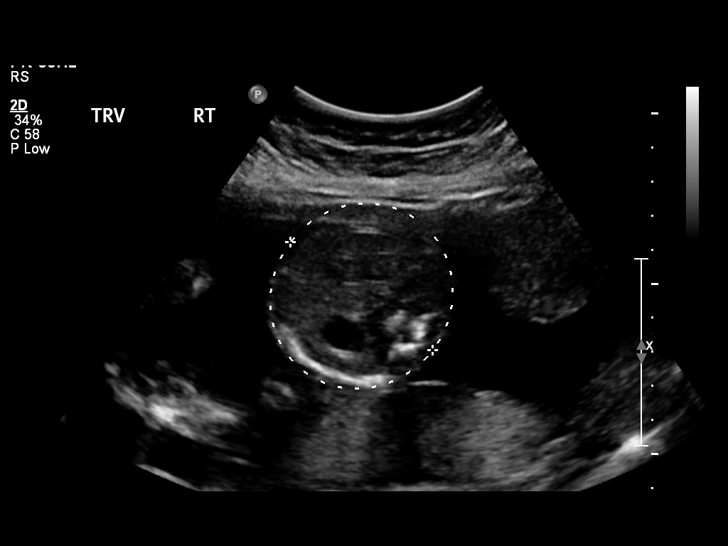
[im 29/55]
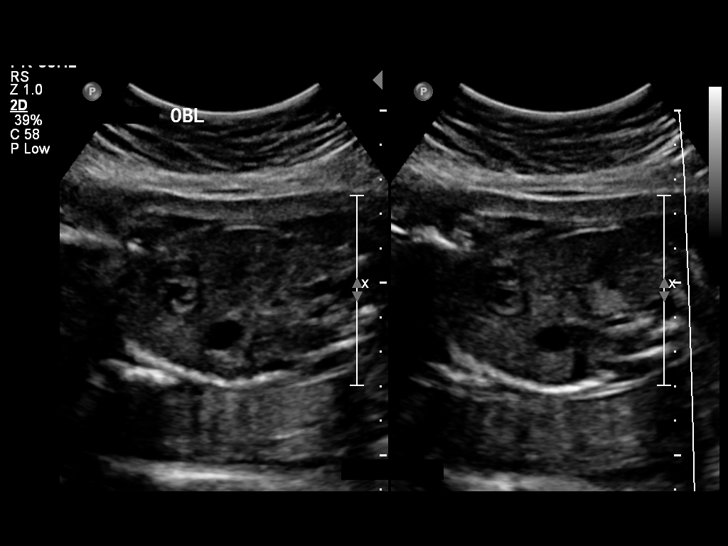
[im 33/55]
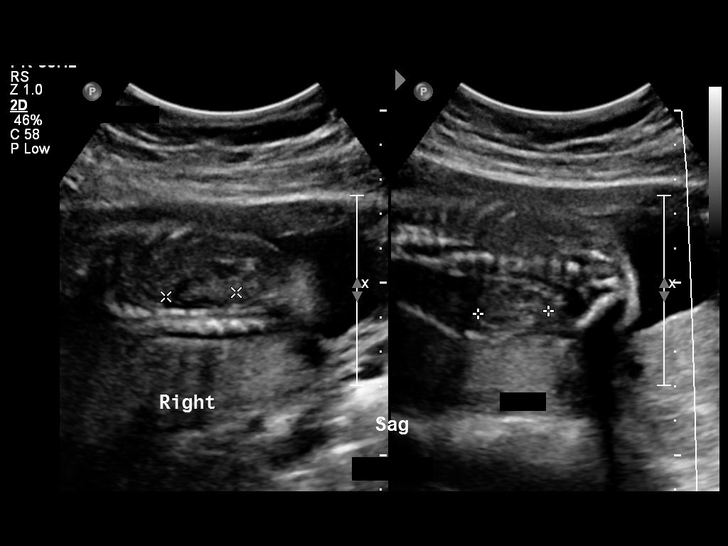
[im 37/55]
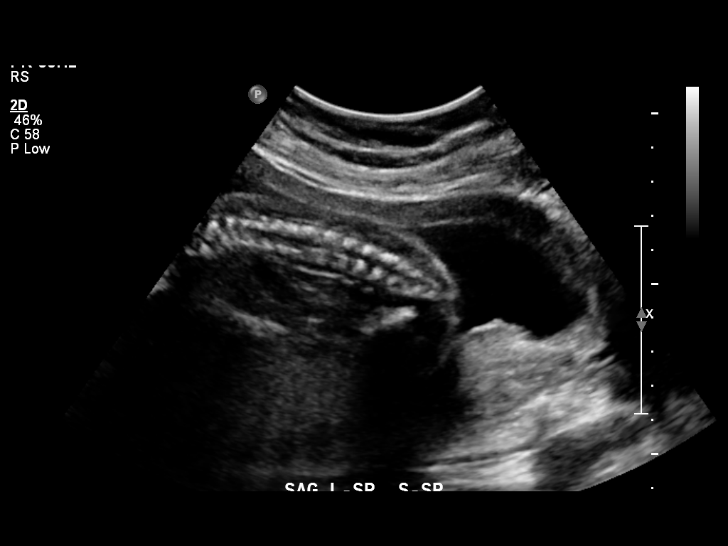
[im 44/55]
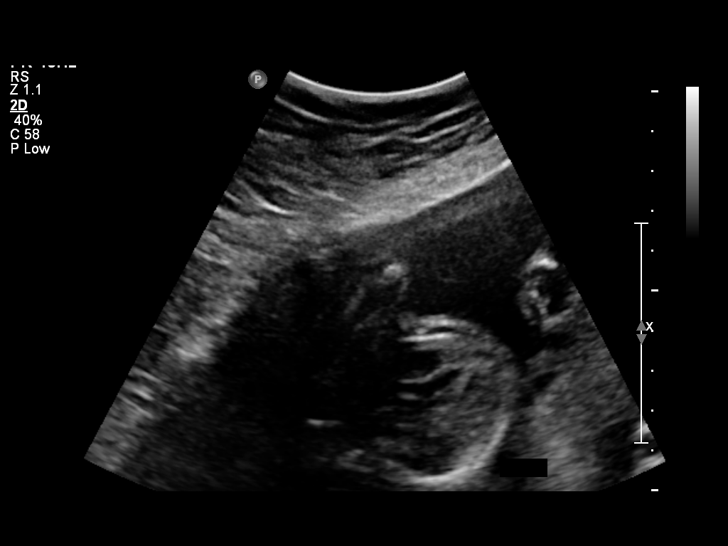
[im 48/55]
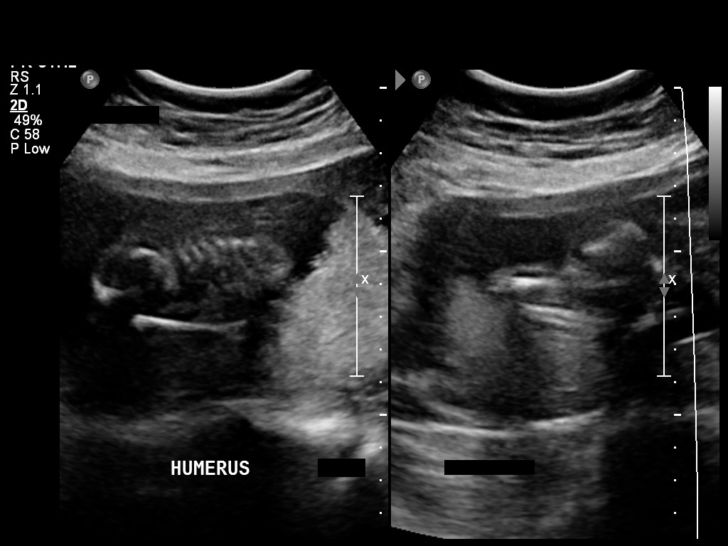
[im 52/55]
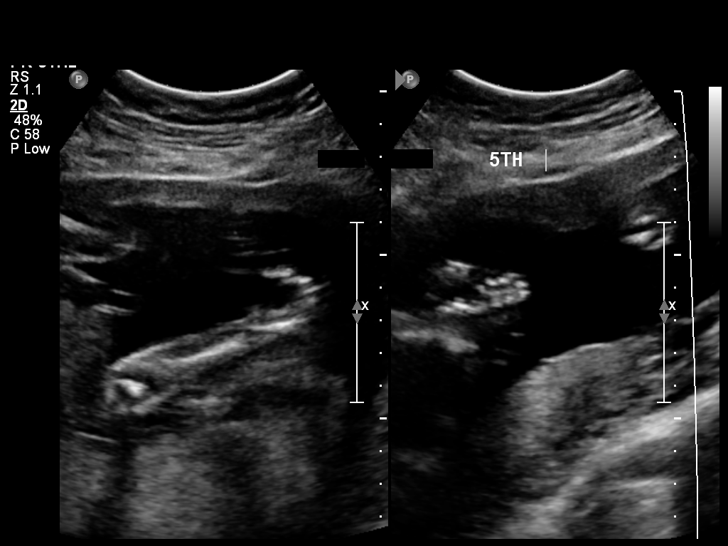

[12 of 28 positions shown; findings below may reference images not displayed]

OBSTETRICS REPORT
                      (Signed Final 06/09/2011 [DATE])

           [facility]

 Order#:         [id number]
Procedures

 US OB DETAIL + 14 WK                                  76811.0
Indications

 Unsure of LMP;  Establish Gestational [AGE]
 Detailed fetal anatomic survey
Fetal Evaluation

 Fetal Heart Rate:  147                          bpm
 Cardiac Activity:  Observed
 Presentation:      Variable
 Placenta:          Posterior, above cervical
                    os
 P. Cord            Visualized
 Insertion:

 Amniotic Fluid
 AFI FV:      Subjectively within normal limits
                                             Larg Pckt:     5.1  cm
Biometry

 BPD:     50.7  mm     G. Age:  21w 3d                CI:        67.06   70 - 86
                                                      FL/HC:      19.7   18.4 -

 HC:     198.3  mm     G. Age:  22w 0d       39  %    HC/AC:      1.17   1.06 -

 AC:     168.9  mm     G. Age:  21w 6d       39  %    FL/BPD:     76.9   71 - 87
 FL:        39  mm     G. Age:  22w 4d       58  %    FL/AC:      23.1   20 - 24
 HUM:     35.1  mm     G. Age:  22w 0d       51  %
 Est. FW:     478  gm      1 lb 1 oz     49  %
Gestational Age

 LMP:           23w 0d        Date:  12/30/10                 EDD:   10/06/11
 U/S Today:     22w 0d                                        EDD:   10/13/11
 Best:          22w 0d     Det. By:  U/S (06/09/11)           EDD:   10/13/11
Genetic Sonogram - Trisomy 21 Screening
 Age:                                             30          Risk=1:   641
 Echogenic bowel:                                 No          LR :
 Hypoplastic/absent Nasal bone:                   No
 Choroid plexus cysts:                            No
 Structural anomalies (inc. cardiac):             No          LR :
 Hypoplastic / absent midphalanx 5th Digit:       No
 Short femur:                                     No          LR :
 Wide space 8st-4nd toes:                         No
 Short humerus:                                   No          LR :
 2-vessel umbilical cord:                         No
 Pyelectasis:                                     No          LR :
 Echogenic cardiac foci:                          No          LR :

 11 Of 11 Criteria Were Visualized and 0 Abnormal(s) Were Seen.
 Ultrasound Modified Risk for Fetal Down Syndrome = [DATE]
Anatomy

 Cranium:           Appears normal      Aortic Arch:       Appears normal
 Fetal Cavum:       Appears normal      Ductal Arch:       Appears normal
 Ventricles:        Appears normal      Diaphragm:         Appears normal
 Choroid Plexus:    Appears normal      Stomach:           Appears
                                                           normal, left
                                                           sided
 Cerebellum:        Appears normal      Abdomen:           Appears normal
 Posterior Fossa:   Appears normal      Abdominal Wall:    Appears nml
                                                           (cord insert,
                                                           abd wall)
 Nuchal Fold:       Not applicable      Cord Vessels:      Appears normal
                    (>20 wks GA)                           (3 vessel cord)
 Face:              Lips and orbits     Kidneys:           Appear normal
                    appear normal
 Heart:             Appears normal      Bladder:           Appears normal
                    (4 chamber &
                    axis)
 RVOT:              Appears normal      Spine:             Previously seen
 LVOT:              Appears normal      Limbs:             Previously seen

 Other:     Female gender. Heels and 5th digit visualized.
Cervix Uterus Adnexa

 Cervical Length:    3.84     cm

 Cervix:       Closed.

 Adnexa:     No abnormality visualized.
Impression

   Single living intrauterine gestation with concordant fetal
 indices and normal visualized anatomy. Today's sonographic
 EGA is 1 week younger than estimated by LMP.  No
 sonographic markers for aneuploidy visualized;  see above
 for US modified Trisomy 21 risk calculation.

 TIGER with us.  Please do not hesitate

## 2012-08-03 ENCOUNTER — Other Ambulatory Visit (HOSPITAL_COMMUNITY): Payer: Self-pay | Admitting: Family

## 2012-08-03 DIAGNOSIS — Z0489 Encounter for examination and observation for other specified reasons: Secondary | ICD-10-CM

## 2012-08-03 LAB — OB RESULTS CONSOLE GC/CHLAMYDIA
Chlamydia: NEGATIVE
Gonorrhea: NEGATIVE

## 2012-08-07 ENCOUNTER — Ambulatory Visit (HOSPITAL_COMMUNITY)
Admission: RE | Admit: 2012-08-07 | Discharge: 2012-08-07 | Disposition: A | Discharge status: Home / Self Care | Payer: Medicaid Other | Source: Ambulatory Visit | Attending: Family | Admitting: Family

## 2012-08-07 DIAGNOSIS — O358XX0 Maternal care for other (suspected) fetal abnormality and damage, not applicable or unspecified: Secondary | ICD-10-CM | POA: Insufficient documentation

## 2012-08-07 DIAGNOSIS — Z363 Encounter for antenatal screening for malformations: Secondary | ICD-10-CM | POA: Insufficient documentation

## 2012-08-07 DIAGNOSIS — Z0489 Encounter for examination and observation for other specified reasons: Secondary | ICD-10-CM

## 2012-08-07 DIAGNOSIS — Z1389 Encounter for screening for other disorder: Secondary | ICD-10-CM | POA: Insufficient documentation

## 2012-08-22 NOTE — L&D Delivery Note (Signed)
Delivery Note At 2:44 PM a viable and healthy female was delivered via VBAC, Spontaneous (Presentation: Right Occiput Anterior).  APGAR: pending ; weight pending.   Placenta status: Intact, Spontaneous.  Cord: 3 vessels with the following complications: None.  Cord pH: n/a  Anesthesia: Other  Episiotomy: None Lacerations: None Suture Repair: n/a Est. Blood Loss (mL): 250  Mom to postpartum.  Baby to nursery-stable.  Levert Feinstein 12/24/2012, 2:59 PM

## 2012-08-22 NOTE — L&D Delivery Note (Signed)
I was present for entire delivery and agree with above resident note. Napoleon Form, MD 12/25/2012 10:36 AM

## 2012-11-20 LAB — OB RESULTS CONSOLE GBS: GBS: POSITIVE

## 2012-12-12 ENCOUNTER — Other Ambulatory Visit (HOSPITAL_COMMUNITY): Payer: Self-pay | Admitting: Physician Assistant

## 2012-12-12 DIAGNOSIS — O480 Post-term pregnancy: Secondary | ICD-10-CM

## 2012-12-14 ENCOUNTER — Ambulatory Visit (HOSPITAL_COMMUNITY)
Admission: RE | Admit: 2012-12-14 | Discharge: 2012-12-14 | Disposition: A | Discharge status: Home / Self Care | Payer: Self-pay | Source: Ambulatory Visit | Attending: Physician Assistant | Admitting: Physician Assistant

## 2012-12-14 DIAGNOSIS — O480 Post-term pregnancy: Secondary | ICD-10-CM | POA: Insufficient documentation

## 2012-12-14 DIAGNOSIS — Z3689 Encounter for other specified antenatal screening: Secondary | ICD-10-CM | POA: Insufficient documentation

## 2012-12-20 ENCOUNTER — Telehealth (HOSPITAL_COMMUNITY): Payer: Self-pay | Admitting: *Deleted

## 2012-12-20 ENCOUNTER — Encounter (HOSPITAL_COMMUNITY): Payer: Self-pay | Admitting: *Deleted

## 2012-12-20 NOTE — Telephone Encounter (Signed)
Preadmission screen Interpreter number [phone]

## 2012-12-21 ENCOUNTER — Ambulatory Visit (HOSPITAL_COMMUNITY): Admission: RE | Admit: 2012-12-21 | Payer: Self-pay | Source: Ambulatory Visit

## 2012-12-21 ENCOUNTER — Ambulatory Visit (INDEPENDENT_AMBULATORY_CARE_PROVIDER_SITE_OTHER): Payer: Self-pay | Admitting: *Deleted

## 2012-12-21 DIAGNOSIS — O480 Post-term pregnancy: Secondary | ICD-10-CM

## 2012-12-21 DIAGNOSIS — O34219 Maternal care for unspecified type scar from previous cesarean delivery: Secondary | ICD-10-CM

## 2012-12-21 NOTE — Progress Notes (Signed)
NST 12/21/12/ reactive

## 2012-12-24 ENCOUNTER — Encounter (HOSPITAL_COMMUNITY): Payer: Self-pay

## 2012-12-24 ENCOUNTER — Inpatient Hospital Stay (HOSPITAL_COMMUNITY)
Admission: RE | Admit: 2012-12-24 | Discharge: 2012-12-25 | DRG: 775 | Disposition: A | Discharge status: Home / Self Care | Payer: Medicaid Other | Source: Ambulatory Visit | Attending: Family Medicine | Admitting: Family Medicine

## 2012-12-24 DIAGNOSIS — O480 Post-term pregnancy: Secondary | ICD-10-CM

## 2012-12-24 DIAGNOSIS — O34219 Maternal care for unspecified type scar from previous cesarean delivery: Secondary | ICD-10-CM

## 2012-12-24 DIAGNOSIS — O9989 Other specified diseases and conditions complicating pregnancy, childbirth and the puerperium: Secondary | ICD-10-CM

## 2012-12-24 DIAGNOSIS — O99892 Other specified diseases and conditions complicating childbirth: Secondary | ICD-10-CM | POA: Diagnosis present

## 2012-12-24 DIAGNOSIS — Z22330 Carrier of Group B streptococcus: Secondary | ICD-10-CM

## 2012-12-24 LAB — TYPE AND SCREEN
ABO/RH(D): O POS
Antibody Screen: NEGATIVE

## 2012-12-24 LAB — CBC
MCHC: 33.2 g/dL (ref 30.0–36.0)
MCV: 87.3 fL (ref 78.0–100.0)
Platelets: 262 10*3/uL (ref 150–400)
RDW: 14.4 % (ref 11.5–15.5)
WBC: 8.6 10*3/uL (ref 4.0–10.5)

## 2012-12-24 MED ORDER — TERBUTALINE SULFATE 1 MG/ML IJ SOLN: 0.2500 mg | Freq: Once | INTRAMUSCULAR | Status: DC | PRN

## 2012-12-24 MED ORDER — LIDOCAINE HCL (PF) 1 % IJ SOLN
30.0000 mL | INTRAMUSCULAR | Status: DC | PRN
  Filled 2012-12-24 (×2): qty 30

## 2012-12-24 MED ORDER — DIBUCAINE 1 % RE OINT: 1.0000 "application " | TOPICAL_OINTMENT | RECTAL | Status: DC | PRN

## 2012-12-24 MED ORDER — DIPHENHYDRAMINE HCL 25 MG PO CAPS: 25.0000 mg | ORAL_CAPSULE | Freq: Four times a day (QID) | ORAL | Status: DC | PRN

## 2012-12-24 MED ORDER — IBUPROFEN 600 MG PO TABS
600.0000 mg | ORAL_TABLET | Freq: Four times a day (QID) | ORAL | Status: DC | PRN
  Administered 2012-12-24: 600 mg via ORAL
  Filled 2012-12-24: qty 1

## 2012-12-24 MED ORDER — OXYTOCIN 40 UNITS IN LACTATED RINGERS INFUSION - SIMPLE MED: 62.5000 mL/h | INTRAVENOUS | Status: DC

## 2012-12-24 MED ORDER — OXYCODONE-ACETAMINOPHEN 5-325 MG PO TABS
1.0000 | ORAL_TABLET | ORAL | Status: DC | PRN
  Administered 2012-12-24 – 2012-12-25 (×3): 1 via ORAL
  Administered 2012-12-25: 2 via ORAL
  Filled 2012-12-24: qty 2
  Filled 2012-12-24 (×3): qty 1

## 2012-12-24 MED ORDER — BENZOCAINE-MENTHOL 20-0.5 % EX AERO
1.0000 "application " | INHALATION_SPRAY | CUTANEOUS | Status: DC | PRN
  Administered 2012-12-24: 1 via TOPICAL
  Filled 2012-12-24 (×2): qty 56

## 2012-12-24 MED ORDER — LANOLIN HYDROUS EX OINT: TOPICAL_OINTMENT | CUTANEOUS | Status: DC | PRN

## 2012-12-24 MED ORDER — ONDANSETRON HCL 4 MG/2ML IJ SOLN: 4.0000 mg | INTRAMUSCULAR | Status: DC | PRN

## 2012-12-24 MED ORDER — OXYTOCIN 40 UNITS IN LACTATED RINGERS INFUSION - SIMPLE MED
1.0000 m[IU]/min | INTRAVENOUS | Status: DC
  Administered 2012-12-24: 1 m[IU]/min via INTRAVENOUS
  Filled 2012-12-24: qty 1000

## 2012-12-24 MED ORDER — WITCH HAZEL-GLYCERIN EX PADS
1.0000 "application " | MEDICATED_PAD | CUTANEOUS | Status: DC | PRN
  Administered 2012-12-24: 1 via TOPICAL

## 2012-12-24 MED ORDER — CITRIC ACID-SODIUM CITRATE 334-500 MG/5ML PO SOLN: 30.0000 mL | ORAL | Status: DC | PRN

## 2012-12-24 MED ORDER — DEXTROSE 5 % IV SOLN
2.5000 10*6.[IU] | INTRAVENOUS | Status: DC
  Administered 2012-12-24: 2.5 10*6.[IU] via INTRAVENOUS
  Filled 2012-12-24 (×5): qty 2.5

## 2012-12-24 MED ORDER — PNEUMOCOCCAL VAC POLYVALENT 25 MCG/0.5ML IJ INJ
0.5000 mL | INJECTION | INTRAMUSCULAR | Status: AC
  Administered 2012-12-25: 0.5 mL via INTRAMUSCULAR
  Filled 2012-12-24: qty 0.5

## 2012-12-24 MED ORDER — ONDANSETRON HCL 4 MG PO TABS: 4.0000 mg | ORAL_TABLET | ORAL | Status: DC | PRN

## 2012-12-24 MED ORDER — FENTANYL CITRATE 0.05 MG/ML IJ SOLN
50.0000 ug | INTRAMUSCULAR | Status: DC | PRN
  Administered 2012-12-24 (×2): 50 ug via INTRAVENOUS
  Filled 2012-12-24 (×4): qty 2

## 2012-12-24 MED ORDER — PENICILLIN G POTASSIUM 5000000 UNITS IJ SOLR
5.0000 10*6.[IU] | Freq: Once | INTRAVENOUS | Status: AC
  Administered 2012-12-24: 5 10*6.[IU] via INTRAVENOUS
  Filled 2012-12-24: qty 5

## 2012-12-24 MED ORDER — SENNOSIDES-DOCUSATE SODIUM 8.6-50 MG PO TABS
2.0000 | ORAL_TABLET | Freq: Every day | ORAL | Status: DC
  Administered 2012-12-24: 2 via ORAL

## 2012-12-24 MED ORDER — PRENATAL MULTIVITAMIN CH
1.0000 | ORAL_TABLET | Freq: Every day | ORAL | Status: DC
  Administered 2012-12-25: 1 via ORAL
  Filled 2012-12-24: qty 1

## 2012-12-24 MED ORDER — SIMETHICONE 80 MG PO CHEW: 80.0000 mg | CHEWABLE_TABLET | ORAL | Status: DC | PRN

## 2012-12-24 MED ORDER — LACTATED RINGERS IV SOLN
INTRAVENOUS | Status: DC
  Administered 2012-12-24: 125 mL/h via INTRAVENOUS

## 2012-12-24 MED ORDER — IBUPROFEN 600 MG PO TABS
600.0000 mg | ORAL_TABLET | Freq: Four times a day (QID) | ORAL | Status: DC
  Administered 2012-12-24 – 2012-12-25 (×4): 600 mg via ORAL
  Filled 2012-12-24 (×4): qty 1

## 2012-12-24 MED ORDER — FENTANYL CITRATE 0.05 MG/ML IJ SOLN
50.0000 ug | Freq: Once | INTRAMUSCULAR | Status: AC
  Administered 2012-12-24: 50 ug via INTRAVENOUS

## 2012-12-24 MED ORDER — TETANUS-DIPHTH-ACELL PERTUSSIS 5-2.5-18.5 LF-MCG/0.5 IM SUSP: 0.5000 mL | Freq: Once | INTRAMUSCULAR | Status: DC

## 2012-12-24 MED ORDER — FENTANYL CITRATE 0.05 MG/ML IJ SOLN: 100.0000 ug | INTRAMUSCULAR | Status: DC | PRN

## 2012-12-24 MED ORDER — ZOLPIDEM TARTRATE 5 MG PO TABS: 5.0000 mg | ORAL_TABLET | Freq: Every evening | ORAL | Status: DC | PRN

## 2012-12-24 MED ORDER — ACETAMINOPHEN 325 MG PO TABS: 650.0000 mg | ORAL_TABLET | ORAL | Status: DC | PRN

## 2012-12-24 MED ORDER — OXYTOCIN BOLUS FROM INFUSION: 500.0000 mL | INTRAVENOUS | Status: DC

## 2012-12-24 MED ORDER — ONDANSETRON HCL 4 MG/2ML IJ SOLN: 4.0000 mg | Freq: Four times a day (QID) | INTRAMUSCULAR | Status: DC | PRN

## 2012-12-24 MED ORDER — OXYCODONE-ACETAMINOPHEN 5-325 MG PO TABS
1.0000 | ORAL_TABLET | ORAL | Status: DC | PRN
  Administered 2012-12-24 (×2): 1 via ORAL
  Filled 2012-12-24 (×2): qty 1

## 2012-12-24 MED ORDER — LACTATED RINGERS IV SOLN: 500.0000 mL | INTRAVENOUS | Status: DC | PRN
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
## 2012-12-24 NOTE — H&P (Signed)
Chart reviewed and agree with management and plan.  

## 2012-12-25 MED ORDER — SENNOSIDES-DOCUSATE SODIUM 8.6-50 MG PO TABS: 2.0000 | ORAL_TABLET | Freq: Every day | ORAL | Status: DC

## 2012-12-25 MED ORDER — IBUPROFEN 600 MG PO TABS: 600.0000 mg | ORAL_TABLET | Freq: Four times a day (QID) | ORAL | Status: DC

## 2012-12-25 NOTE — Progress Notes (Signed)
UR chart review completed.  

## 2012-12-25 NOTE — Discharge Planning (Signed)
This RN receiving patient at 1500, patient just received a perc and discharge discussed.  GBS + status along with other pertinent medical history reviewed with day shift nurse and discharge still planned as stated by day nurse.
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
## 2012-12-25 NOTE — Discharge Summary (Signed)
Obstetric Discharge Summary Reason for Admission: induction of labor for post-dates Prenatal Procedures: none Intrapartum Procedures: spontaneous vaginal delivery and GBS prophylaxis Postpartum Procedures: none Complications-Operative and Postpartum: none Hemoglobin  Date Value Range Status  12/24/2012 11.2* 12.0 - 15.0 g/dL Final     HCT  Date Value Range Status  12/24/2012 33.7* 36.0 - 46.0 % Final    Physical Exam:  General: alert, cooperative, appears stated age and no distress Lochia: appropriate Uterine Fundus: firm Incision: n/a DVT Evaluation: No evidence of DVT seen on physical exam.  Discharge Diagnoses: Post-date pregnancy  Discharge Information: Date: 12/25/2012 Activity: pelvic rest Diet: routine Medications: PNV, Ibuprofen and Colace Condition: stable Instructions: refer to practice specific booklet Discharge to: home Follow-up Information   Follow [facility] with [facility] In 6 weeks.   Contact information:   [address] [place] [address] [phone]      Newborn Data: Live born female  Birth Weight: 7 lb 11.1 oz (3490 g) APGAR: 9, 9  Home with mother.  Lulla [place] 12/25/2012, 9:04 AM

## 2012-12-25 NOTE — Discharge Summary (Signed)
Attestation of Attending Supervision of Resident: Evaluation and management procedures were performed by the Family Medicine Resident under my supervision.  I have seen and examined the patient, reviewed the resident's note and chart, and I agree with the management and plan.  UGONNA  ANYANWU, MD, FACOG Attending Obstetrician & Gynecologist Faculty Practice, [facility]  

## 2012-12-28 ENCOUNTER — Inpatient Hospital Stay (HOSPITAL_COMMUNITY): Admission: RE | Admit: 2012-12-28 | Payer: Medicaid Other | Source: Ambulatory Visit

## 2013-01-23 ENCOUNTER — Encounter: Payer: Self-pay | Admitting: *Deleted

## 2013-02-08 ENCOUNTER — Encounter (HOSPITAL_COMMUNITY): Payer: Self-pay | Admitting: *Deleted

## 2014-06-23 ENCOUNTER — Encounter (HOSPITAL_COMMUNITY): Payer: Self-pay | Admitting: *Deleted

## 2014-10-14 ENCOUNTER — Emergency Department (HOSPITAL_COMMUNITY)
Admission: EM | Admit: 2014-10-14 | Discharge: 2014-10-15 | Disposition: A | Discharge status: Home / Self Care | Payer: Self-pay | Attending: Emergency Medicine | Admitting: Emergency Medicine

## 2014-10-14 ENCOUNTER — Encounter (HOSPITAL_COMMUNITY): Payer: Self-pay | Admitting: Emergency Medicine

## 2014-10-14 DIAGNOSIS — M549 Dorsalgia, unspecified: Secondary | ICD-10-CM

## 2014-10-14 DIAGNOSIS — R202 Paresthesia of skin: Secondary | ICD-10-CM | POA: Insufficient documentation

## 2014-10-14 DIAGNOSIS — Z862 Personal history of diseases of the blood and blood-forming organs and certain disorders involving the immune mechanism: Secondary | ICD-10-CM | POA: Insufficient documentation

## 2014-10-14 DIAGNOSIS — Z8719 Personal history of other diseases of the digestive system: Secondary | ICD-10-CM | POA: Insufficient documentation

## 2014-10-14 DIAGNOSIS — Z9089 Acquired absence of other organs: Secondary | ICD-10-CM | POA: Insufficient documentation

## 2014-10-14 DIAGNOSIS — M25531 Pain in right wrist: Secondary | ICD-10-CM | POA: Insufficient documentation

## 2014-10-14 DIAGNOSIS — Z791 Long term (current) use of non-steroidal anti-inflammatories (NSAID): Secondary | ICD-10-CM | POA: Insufficient documentation

## 2014-10-14 DIAGNOSIS — R1031 Right lower quadrant pain: Secondary | ICD-10-CM

## 2014-10-14 DIAGNOSIS — R102 Pelvic and perineal pain: Secondary | ICD-10-CM | POA: Insufficient documentation

## 2014-10-14 DIAGNOSIS — Z8659 Personal history of other mental and behavioral disorders: Secondary | ICD-10-CM | POA: Insufficient documentation

## 2014-10-14 DIAGNOSIS — M545 Low back pain: Secondary | ICD-10-CM | POA: Insufficient documentation

## 2014-10-14 DIAGNOSIS — Z87828 Personal history of other (healed) physical injury and trauma: Secondary | ICD-10-CM | POA: Insufficient documentation

## 2014-10-14 DIAGNOSIS — R140 Abdominal distension (gaseous): Secondary | ICD-10-CM | POA: Insufficient documentation

## 2014-10-14 DIAGNOSIS — J45909 Unspecified asthma, uncomplicated: Secondary | ICD-10-CM | POA: Insufficient documentation

## 2014-10-14 DIAGNOSIS — M25532 Pain in left wrist: Secondary | ICD-10-CM | POA: Insufficient documentation

## 2014-10-14 DIAGNOSIS — Z9889 Other specified postprocedural states: Secondary | ICD-10-CM | POA: Insufficient documentation

## 2014-10-14 DIAGNOSIS — Z3202 Encounter for pregnancy test, result negative: Secondary | ICD-10-CM | POA: Insufficient documentation

## 2014-10-14 DIAGNOSIS — R1032 Left lower quadrant pain: Secondary | ICD-10-CM

## 2014-10-14 LAB — CBC WITH DIFFERENTIAL/PLATELET
BASOS ABS: 0 10*3/uL (ref 0.0–0.1)
BASOS PCT: 0 % (ref 0–1)
EOS ABS: 0.2 10*3/uL (ref 0.0–0.7)
EOS PCT: 2 % (ref 0–5)
HCT: 38 % (ref 36.0–46.0)
Hemoglobin: 12.4 g/dL (ref 12.0–15.0)
Lymphocytes Relative: 24 % (ref 12–46)
Lymphs Abs: 1.9 10*3/uL (ref 0.7–4.0)
MCH: 29.6 pg (ref 26.0–34.0)
MCHC: 32.6 g/dL (ref 30.0–36.0)
MCV: 90.7 fL (ref 78.0–100.0)
Monocytes Absolute: 0.4 10*3/uL (ref 0.1–1.0)
Monocytes Relative: 6 % (ref 3–12)
NEUTROS PCT: 68 % (ref 43–77)
Neutro Abs: 5.5 10*3/uL (ref 1.7–7.7)
PLATELETS: 311 10*3/uL (ref 150–400)
RBC: 4.19 MIL/uL (ref 3.87–5.11)
RDW: 13.1 % (ref 11.5–15.5)
WBC: 8.1 10*3/uL (ref 4.0–10.5)

## 2014-10-14 LAB — I-STAT CHEM 8, ED
BUN: 15 mg/dL (ref 6–23)
Calcium, Ion: 1.15 mmol/L (ref 1.12–1.23)
Chloride: 105 mmol/L (ref 96–112)
Creatinine, Ser: 0.8 mg/dL (ref 0.50–1.10)
GLUCOSE: 97 mg/dL (ref 70–99)
HEMATOCRIT: 43 % (ref 36.0–46.0)
HEMOGLOBIN: 14.6 g/dL (ref 12.0–15.0)
POTASSIUM: 4.3 mmol/L (ref 3.5–5.1)
SODIUM: 141 mmol/L (ref 135–145)
TCO2: 21 mmol/L (ref 0–100)

## 2014-10-14 LAB — URINALYSIS, ROUTINE W REFLEX MICROSCOPIC
Bilirubin Urine: NEGATIVE
GLUCOSE, UA: NEGATIVE mg/dL
Ketones, ur: NEGATIVE mg/dL
Leukocytes, UA: NEGATIVE
Nitrite: NEGATIVE
PH: 6 (ref 5.0–8.0)
PROTEIN: NEGATIVE mg/dL
Specific Gravity, Urine: 1.006 (ref 1.005–1.030)
Urobilinogen, UA: 0.2 mg/dL (ref 0.0–1.0)

## 2014-10-14 LAB — URINE MICROSCOPIC-ADD ON

## 2014-10-14 LAB — POC URINE PREG, ED: PREG TEST UR: NEGATIVE

## 2014-10-14 NOTE — ED Provider Notes (Signed)
CSN: [id number]     Arrival date & time 10/14/14  2142 History   First MD Initiated Contact with Patient 10/14/14 2239     Chief Complaint  Patient presents with  . Abdominal Pain    (Consider location/radiation/quality/duration/timing/severity/associated sxs/prior Treatment) HPI Abigail Rubio is a 34 yo female presenting with several complaints including back pain, abdominal pain and wrist pain. She states she began having bilateral wrist pain, numbness and tingling times several weeks, usually worse at nighttime.  She reports she has been having lower back pain x 5 days.  The pain is midline back but radiates down both legs.  The pain is made worse by bending or sitting.  She also reports abd pain since yesterday and abd distention.  She reports drinking 5 alcoholic beverages per week. Her LMP was 3 weeks ago.  Her last BM was today and it was soft and normal.  She denies fever, chills, nausea, vomiting or diarrhea.   Past Medical History  Diagnosis Date  . No pertinent past medical history   . Asthma   . H/O postpartum depression, currently pregnant   . Abnormal Pap smear   . Language barrier   . History of positive PPD 2007    negative chest X ray completed tx 2007  . Asthma 1980  . Abnormal Pap smear 2004    during 1st pregnancy.   . Depression   . Trauma to ear     left  . Anemia   . Hx of hepatitis   . GERD (gastroesophageal reflux disease)   . Late prenatal care   . History of positive PPD     negative CXR   Past Surgical History  Procedure Laterality Date  . Cesarean section    . Appendectomy    . Gynecologic cryosurgery    . Colposcopy  2004  . Apendectomy  2003  . Cesarean section  2004   Family History  Problem Relation Age of Onset  . Cancer Maternal Aunt     breast  . Heart disease Maternal Aunt   . Cancer Maternal Uncle     prostate  . Hypertension Maternal Grandmother   . Hyperlipidemia Maternal Grandfather   . Kidney disease Sister    . Urolithiasis Sister   . Anemia Mother   . Birth defects Maternal Uncle     hole in heart  . Anemia Maternal Grandmother   . Diabetes Maternal Grandfather    History  Substance Use Topics  . Smoking status: Passive Smoke Exposure - Never Smoker  . Smokeless tobacco: Never Used  . Alcohol Use: No   OB History    Gravida Para Term Preterm AB TAB SAB Ectopic Multiple Living   Review of Systems  Constitutional: Negative for fever and chills.  HENT: Negative for sore throat.   Eyes: Negative for visual disturbance.  Respiratory: Negative for cough and shortness of breath.   Cardiovascular: Negative for chest pain and leg swelling.  Gastrointestinal: Positive for abdominal pain. Negative for nausea, vomiting and diarrhea.  Genitourinary: Negative for dysuria.  Musculoskeletal: Positive for back pain and arthralgias. Negative for myalgias.  Skin: Negative for rash.  Neurological: Negative for weakness, numbness and headaches.    Allergies  Pork-derived products  Home Medications   Prior to Admission medications   Medication Sig Start Date End Date Taking? Authorizing Provider  acetaminophen (TYLENOL) 325 MG tablet Take 325 mg by  mouth daily as needed for pain.    Historical Provider, MD  calcium carbonate (TUMS - DOSED IN MG ELEMENTAL CALCIUM) 500 MG chewable tablet Chew 2 tablets by mouth 3 (three) times daily. indigestion     Historical Provider, MD  calcium carbonate (TUMS - DOSED IN MG ELEMENTAL CALCIUM) 500 MG chewable tablet Chew 2 tablets by mouth 2 (two) times daily.    Historical Provider, MD  fexofenadine (ALLEGRA) 180 MG tablet Take 180 mg by mouth daily.    Historical Provider, MD  ibuprofen (ADVIL,MOTRIN) 600 MG tablet Take 1 tablet (600 mg total) by mouth every 6 (six) hours. 12/25/12   Jacquelyn A McGill, MD  Prenatal Vit-Fe Fumarate-FA (MULTIVITAMIN-PRENATAL) 27-0.8 MG TABS Take 1 tablet by mouth daily.      Historical Provider, MD  Prenatal  Vit-Fe Fumarate-FA (PRENATAL MULTIVITAMIN) TABS Take 1 tablet by mouth daily.    Historical Provider, MD  senna-docusate (SENOKOT-S) 8.6-50 MG per tablet Take 2 tablets by mouth at bedtime. 12/25/12   Jacquelyn A McGill, MD   BP 132/78 mmHg  Pulse 75  Temp(Src) 98.6 F (37 C) (Oral)  Resp 16  Ht  (1.549 m)  Wt 170 lb (77.111 kg)  BMI 32.14 kg/m2  SpO2 99%  LMP 09/23/2014 Physical Exam  Constitutional: She appears well-developed and well-nourished. No distress.  HENT:  Head: Normocephalic and atraumatic.  Mouth/Throat: Oropharynx is clear and moist. No oropharyngeal exudate.  Eyes: Conjunctivae are normal.  Neck: Neck supple. No thyromegaly present.  Cardiovascular: Normal rate, regular rhythm and intact distal pulses.   Pulmonary/Chest: Effort normal and breath sounds normal. No respiratory distress. She has no wheezes. She has no rales. She exhibits no tenderness.  Abdominal: Soft. She exhibits distension. She exhibits no mass. There is no hepatosplenomegaly. There is tenderness. There is guarding. There is no rigidity, no rebound, no CVA tenderness, no tenderness at McBurney's point and negative Murphy's sign.    Genitourinary: There is no tenderness on the right labia. There is no tenderness on the left labia. Uterus is tender. Uterus is not enlarged. Cervix exhibits no motion tenderness, no discharge and no friability. Right adnexum displays no tenderness and no fullness. Left adnexum displays tenderness. Left adnexum displays no fullness. No tenderness in the vagina. No vaginal discharge found.  Musculoskeletal: She exhibits no tenderness.  Lymphadenopathy:    She has no cervical adenopathy.  Neurological: She is alert.  Skin: Skin is warm and dry. No rash noted. She is not diaphoretic.  Psychiatric: She has a normal mood and affect.  Nursing note and vitals reviewed.   ED Course  Procedures (including critical care time)   EMERGENCY DEPARTMENT [place] HEPATOBILIARY  INTERPRETATIOIN  INDICATIONS: Painand abd distention  PERFORMED BY:  Myself and Dr. Rhunette Croft  IMAGES ARCHIVED?: Yes  FINDINGS: No ascites, no gallstones  LIMITATIONS:  Body Habitus  INTERPRETATION:  No Abdominal Free Fluid   Labs Review Labs Reviewed  WET PREP, GENITAL - Abnormal; Notable for the following:    WBC, Wet Prep HPF POC FEW (*)    All other components within normal limits  URINALYSIS, ROUTINE W REFLEX MICROSCOPIC - Abnormal; Notable for the following:    Color, Urine STRAW (*)    Hgb urine dipstick TRACE (*)    All other components within normal limits  HEPATIC FUNCTION PANEL - Abnormal; Notable for the following:    Total Bilirubin 0.2 (*)    All other components within normal limits  CBC WITH DIFFERENTIAL/PLATELET  URINE MICROSCOPIC-ADD  ON  POC URINE PREG, ED  I-STAT CHEM 8, ED  GC/CHLAMYDIA PROBE AMP ([place])    Imaging Review No results found.   EKG Interpretation None      MDM   Final diagnoses:  None   34 yo presenting with 3 complaints. The first is wrist pain and hand tingling x several weeks, with positive phalen's test that is consistent with carpal tunnel syndrome. The second is lower midline back pain that radiates down her legs.  She has no alteration in strength or sensation and no report of injury or loss of bowel or bladder control. She also reports abd distention and discomfort onset yesterday.  On exam, her abd is distended but soft, she reports regular bm, last today and her upreg is negative. CBC, BMP, LFTs and UA negative for significant abnormality.  Discussed case with Dr. Rhunette CroftNanavati.  Bedside US performed to eval for free fluid, none identified. Pelvic exam performed with mild left adnexal tenderness noted but no CMT or other significant finding. Pt's pain improved significantly after IV pain meds.    1:05 AM: At end of shift, hand-off report given to Dr. Rhunette CroftNanavati. Plan includes pelvic US and possibly abd/pelvis CT.  Filed  Vitals:   10/14/14 2149  BP: 132/78  Pulse: 75  Temp: 98.6 F (37 C)  TempSrc: Oral  Resp: 16  Height: 5\' 1"  (1.549 m)  Weight: 170 lb (77.111 kg)  SpO2: 99%   Meds given in ED:  Medications  HYDROmorphone (DILAUDID) injection 1 mg (1 mg Intravenous Given 10/15/14 0020)    New Prescriptions   No medications on file       Harle BattiestElizabeth Zubayr Bednarczyk, NP 10/15/14 1204  Derwood KaplanAnkit Nanavati, MD 10/15/14 2310

## 2014-10-14 NOTE — ED Notes (Signed)
Pt st's she started having lower back pain 5 days ago now having lower abd pain with swelling.  Pt denies any nausea or vomiting.  Denies urinary symptoms

## 2014-10-15 ENCOUNTER — Emergency Department (HOSPITAL_COMMUNITY): Payer: Medicaid Other

## 2014-10-15 ENCOUNTER — Encounter (HOSPITAL_COMMUNITY): Payer: Self-pay

## 2014-10-15 ENCOUNTER — Emergency Department (HOSPITAL_COMMUNITY): Payer: Self-pay

## 2014-10-15 LAB — HEPATIC FUNCTION PANEL
ALBUMIN: 4.1 g/dL (ref 3.5–5.2)
ALK PHOS: 54 U/L (ref 39–117)
ALT: 17 U/L (ref 0–35)
AST: 24 U/L (ref 0–37)
BILIRUBIN TOTAL: 0.2 mg/dL — AB (ref 0.3–1.2)
Bilirubin, Direct: <0.1 mg/dL (ref 0.0–0.5)
Total Protein: 7.5 g/dL (ref 6.0–8.3)

## 2014-10-15 LAB — WET PREP, GENITAL
Clue Cells Wet Prep HPF POC: NONE SEEN
Trich, Wet Prep: NONE SEEN
Yeast Wet Prep HPF POC: NONE SEEN

## 2014-10-15 LAB — GC/CHLAMYDIA PROBE AMP (~~LOC~~) NOT AT ARMC
Chlamydia: NEGATIVE
Neisseria Gonorrhea: NEGATIVE

## 2014-10-15 MED ORDER — MORPHINE SULFATE 4 MG/ML IJ SOLN
4.0000 mg | Freq: Once | INTRAMUSCULAR | Status: AC
  Administered 2014-10-15: 4 mg via INTRAVENOUS
  Filled 2014-10-15: qty 1

## 2014-10-15 MED ORDER — HYDROCODONE-ACETAMINOPHEN 5-325 MG PO TABS: 1.0000 | ORAL_TABLET | Freq: Four times a day (QID) | ORAL | Status: DC | PRN

## 2014-10-15 MED ORDER — HYDROMORPHONE HCL 1 MG/ML IJ SOLN
1.0000 mg | Freq: Once | INTRAMUSCULAR | Status: AC
  Administered 2014-10-15: 1 mg via INTRAVENOUS
  Filled 2014-10-15: qty 1

## 2014-10-15 MED ORDER — IBUPROFEN 600 MG PO TABS: 600.0000 mg | ORAL_TABLET | Freq: Four times a day (QID) | ORAL | Status: DC | PRN

## 2014-10-15 MED ORDER — IOHEXOL 300 MG/ML  SOLN
25.0000 mL | Freq: Once | INTRAMUSCULAR | Status: AC | PRN
  Administered 2014-10-15: 25 mL via ORAL

## 2014-10-15 MED ORDER — SODIUM CHLORIDE 0.9 % IV BOLUS (SEPSIS)
1000.0000 mL | Freq: Once | INTRAVENOUS | Status: AC
  Administered 2014-10-15: 1000 mL via INTRAVENOUS

## 2014-10-15 MED ORDER — IOHEXOL 300 MG/ML  SOLN
100.0000 mL | Freq: Once | INTRAMUSCULAR | Status: AC | PRN
  Administered 2014-10-15: 100 mL via INTRAVENOUS

## 2014-10-15 NOTE — ED Notes (Signed)
Patient returned from CT

## 2014-10-15 NOTE — Discharge Instructions (Signed)
We saw you in the ER for the abdominal pain. All the results in the ER are normal, labs and imaging. We are not sure what is causing your symptoms. The workup in the ER is not complete, and is limited to screening for life threatening and emergent conditions only, so please see a primary care doctor for further evaluation.   Dolor abdominal en las mujeres (Abdominal Pain, Women) El dolor abdominal (en el estmago, la pelvis o el vientre) puede tener muchas causas. Es importante que le informe a su mdico:  La ubicacin del Engineer, miningdolor.  Viene y se va, o persiste todo el tiempo?  Hay situaciones que Location managerinician el dolor (comer ciertos alimentos, la actividad fsica)?  Tiene otros sntomas asociados al dolor (fiebre, nuseas, vmitos, diarrea)? Todo es de gran ayuda cuando se trata de hallar la causa del dolor. CAUSAS  Estmago: Infecciones por virus o bacterias, o lcera.  Intestino: Apendicitis (apndice inflamado), ileitis regional (enfermedad de Crohn), colitis ulcerosa (colon inflamado), sndrome del colon irritable, diverticulitis (inflamacin de los divertculos del colon) o cncer de estmago oo intestino.  Enfermedades de la vescula biliar o clculos.  Enfermedades renales, clculos o infecciones en el rin.  Infeccin o cncer del pncreas.  Fibromialgia (trastorno doloroso)  Enfermedades de los rganos femeninos:  Uterus: tero: fibroma (tumor no canceroso) o infeccin  Trompas de Falopio: infeccin o embarazo ectpico  En los ovarios, quistes o tumores.  Adherencias plvicas (tejido cicatrizal).  Endometriosis (el tejido que cubre el tero se desarrolla en la pelvis y los rganos plvicos).  Sndrome de Agricultural engineercongestin plvica (los rganos femeninos se llenan de sangre antes del periodo menstrual(  Dolor durante el periodo menstrual.  Dolor durante la ovulacin (al producir vulos).  Dolor al usar el DIU (dispositivo intrauterino para el control de la  natalidad)  Psychologist, clinicalCncer en los rganos femeninos.  Dolor funcional (no est originado en una enfermedad, puede mejorar sin tratamiento).  Dolor de origen psicolgico  Depresin. DIAGNSTICO Su mdico decidir la gravedad del dolor a travs del examen fsico  Anlisis de sangre  Radiografas  Ecografas  TC (tomografa computada, tipo especial de radiografas).  IMR (resonancia magntica)  Cultivos, en el caso una infeccin  Colon por enema de bario (se inserta una sustancia de contraste en el intestino grueso para mejorar la observacin con rayos X.)  Colonoscopa (observacin del intestino con un tubo luminoso).  Laparoscopa (examen del interior del abdomen con un tubo que tiene Intel Corporationuna luz).  Ciruga exploratoria abdominal mayor (se observa el abdomen realizando una gran incisin). TRATAMIENTO El tratamiento depender de la causa del problema.   Muchos de estos casos pueden controlarse y tratarse en casa.  Medicamentos de venta libre indicados por el mdico.  Medicamentos con receta.  Antibiticos, en caso de infeccin  Pldoras anticonceptivas, en el caso de perodos dolorosos o dolor al ovular.  Tratamiento hormonal, para la endometriosis  Inyecciones para bloqueo nervioso selectivo.  Fisioterapia.  Antidepresivos.  Consejos por parte de un psclogo o psiquiatra.  Ciruga mayor o menor. INSTRUCCIONES PARA EL CUIDADO DOMICILIARIO  No tome ni administre laxantes a menos que se lo haya indicado su mdico.  Tome analgsicos de venta libre slo si se lo ha indicado el profesional que lo asiste. No tome aspirina, ya que puede causar Apple Computermolestias en el estmago o hemorragias.  Consuma una dieta lquida (caldo o agua) segn lo indicado por el mdico. Progrese lentamente a una dieta blanda, segn la tolerancia, si el dolor se relaciona con Investment banker, corporateel estmago  o el intestino.  Tenga un termmetro y tmese la temperatura varias veces al da.  Haga reposo en la cama y [place],  si esto Research scientist (life sciences).  Evite las relaciones sexuales, Counsellor.  Evite las situaciones estresantes.  Cumpla con las visitas y los anlisis de control, segn las indicaciones de su mdico.  Si el dolor no se [place] con los medicamentos o la [place], [place] tratar con:  Acupuntura.  Ejercicios de relajacin (yoga, meditacin).  Terapia grupal.  Psicoterapia. SOLICITE ATENCIN MDICA SI:  Nota que ciertos Pharmacist, community de [place].  El tratamiento indicado para Arboriculturist no Marketing executive.  Necesita analgsicos ms fuertes.  Quiere que le retiren el DIU.  Si se siente confundido o desfalleciente.  Presenta nuseas o vmitos.  Aparece una erupcin cutnea.  Sufre efectos adversos o una reaccin alrgica debido a los medicamentos que toma. SOLICITE ATENCIN MDICA DE INMEDIATO SI:  El dolor persiste o se agrava.  Tiene fiebre.  Siente el dolor slo en algunos sectores del abdomen. Si se localiza en la zona derecha, posiblemente podra tratarse de apendicitis. En un adulto, si se localiza en la regin inferior izquierda del abdomen, podra tratarse de colitis o diverticulitis.  Hay sangre en las heces (deposiciones de color rojo brillante o negro alquitranado), con o sin vmitos.  Usted presenta sangre en la orina.  Siente escalofros con o sin fiebre.  Se desmaya. ASEGRESE QUE:   Comprende estas instrucciones.  Controlar su enfermedad.  Solicitar ayuda de inmediato si no mejora o si empeora. Document Released: 11/24/2008 Document Revised: 10/31/2011 [facility] Patient Information 2015 [place], [place]. This information is not intended to replace advice given to you by your health care provider. Make sure you discuss any questions you have with your health care provider.

## 2014-10-15 NOTE — ED Notes (Signed)
Dr. Shyrl NumbersNanavanti at the bedside to update patient and family on results.

## 2014-10-15 NOTE — ED Notes (Signed)
Called CT to report patient has finished drinking contrast. 

## 2014-12-04 ENCOUNTER — Ambulatory Visit (INDEPENDENT_AMBULATORY_CARE_PROVIDER_SITE_OTHER): Payer: Self-pay | Admitting: Obstetrics & Gynecology

## 2014-12-04 ENCOUNTER — Encounter: Payer: Self-pay | Admitting: Obstetrics & Gynecology

## 2014-12-04 VITALS — BP 112/79 | HR 69 | Ht <= 58 in | Wt 170.6 lb

## 2014-12-04 DIAGNOSIS — N920 Excessive and frequent menstruation with regular cycle: Secondary | ICD-10-CM

## 2014-12-04 MED ORDER — NORGESTIMATE-ETH ESTRADIOL 0.25-35 MG-MCG PO TABS: 1.0000 | ORAL_TABLET | Freq: Every day | ORAL | Status: DC

## 2014-12-04 MED ORDER — IBUPROFEN 600 MG PO TABS: 600.0000 mg | ORAL_TABLET | Freq: Four times a day (QID) | ORAL | Status: DC | PRN

## 2014-12-04 NOTE — Patient Instructions (Signed)
Uso de los anticonceptivos orales (Oral Contraception Use) Los anticonceptivos orales (ACO) son medicamentos que se utilizan para evitar el embarazo. Su funcin es evitar que los ovarios liberen vulos. Las hormonas de los ACO tambin hacen que el moco cervical se haga ms espeso, lo que evita que el esperma ingrese al tero. Tambin hacen que la membrana que recubre internamente al tero se vuelva ms fina, lo que no permite que el huevo fertilizado se adhiera a la pared del tero. Los ACO son muy efectivos cuando se toman exactamente como se prescriben. Sin embargo, no previenen contra las enfermedades de transmisin sexual (ETS). La prctica del sexo seguro, como el uso de preservativos, junto con los ACO, ayudan a prevenir ese tipo de enfermedades. Antes de tomar ACO, debe hacerse un examen fsico y un Papanicolau. El mdico podr indicarle anlisis de sangre, si es necesario. El mdico se asegurar de que usted sea una buena candidata para usar anticonceptivos orales. Converse con su mdico acerca de los posibles efectos secundarios de los ACO que podran recetarle. Cuando se inicia el uso de ACO, se pueden tomar durante 2 a 3 meses para que el cuerpo se adapte a los cambios en los niveles hormonales en el cuerpo.  CMO TOMAR LOS ANTICONCEPTIVOS ORALES El mdico le indicar como comenzar a tomar el primer ciclo de ACO. De lo contrario usted puede:   Comenzar el da de inicio del ciclo menstrual. No necesitar proteccin anticonceptiva adicional al comenzar en este momento.   Comenzar el primer domingo luego de su perodo menstrual, o el da en que adquiere el medicamento. En estos casos deber tener proteccin anticonceptiva adicional durante los primeros 7 das del ciclo.   Comenzar a tomarlos en cualquier momento del ciclo. Si toma el anticonceptivo dentro de los 5 das de iniciado el perodo, estar protegida de quedar embarazada inmediatamente. En este caso, no necesitar una forma adicional de  anticonceptivos. Si comienza en cualquier otro momento del ciclo menstrual, necesitar usar otra forma de anticonceptivo durante 7 das. Si sus ACO son del tipo de los llamados minipldoras, podrn impedir el embarazo despus de tomarlas por 2 das (48 horas). Luego de comenzar a tomar los ACO:   Si olvid de tomar 1 pldora, tmela tan pronto como lo recuerde. Tome la siguiente pldora a la hora habitual.   Si dej de tomar 2 o ms pldoras, comunquese con su mdico ya que diferentes pldoras tienen diferentes instrucciones para las dosis que no se han tomado. Si olvida tomar 2 o ms pldoras, utilice un mtodo anticonceptivo adicional hasta que comience su prximo perodo menstrual.   Si utiliza el envase de 28 pldoras que contienen pldoras inactivas y olvida tomar 1 de las ltimas 7 (pldoras sin hormonas), sto no tiene importancia. Simplemente deseche el resto de las pldoras que no contienen hormonas y comience un nuevo envase.  No importa cuando comience a tomar los anticonceptivos, siempre empiece un nuevo envase el mismo da de la semana. Tenga un envase extra de ACO y use un mtodo anticonceptivo adicional para el caso en que se olvide de tomar algunas pldoras o pierda la caja.  INSTRUCCIONES PARA EL CUIDADO EN EL HOGAR   No fume.   Use siempre un condn para protegerse contra las enfermedades de transmisin sexual. Los ACO no protegen contra las enfermedades de transmisin sexual.   Use un almanaque para marcar los das de su perodo menstrual.   Lea la informacin y consejos que vienen con las ACO. Hable con   el profesional si tiene dudas.  SOLICITE ATENCIN MDICA SI:   Presenta nuseas o vmitos.   Tiene flujo o sangrado vaginal anormal.   Aparece una erupcin cutnea.   No tiene el perodo menstrual.   Pierde el cabello.   Necesita tratamiento por cambios en su estado de nimo o por depresin.   Se siente mareada al [employer].   Comienza a  aparecer acn con el uso de los ACO.   Ardelle Anton.  SOLICITE ATENCIN MDICA DE INMEDIATO SI:   Siente dolor en el pecho.   Le falta el aire.   Le duele mucho la cabeza y no puede Human resources officer.   Siente adormecimiento o tiene dificultad para hablar.   Tiene problemas de visin.   Presenta dolor, inflamacin o hinchazn en las piernas.  Document Released: 07/28/2011 Document Revised: 04/10/2013 [facility] Patient Information 2015 [place], [place]. This information is not intended to replace advice given to you by your health care provider. Make sure you discuss any questions you have with your health care provider. Quiste ovrico (Ovarian Cyst) Un quiste ovrico es una bolsa llena de lquido que se forma en el ovario. Los ovarios son los rganos pequeos que producen vulos en las mujeres. Se pueden formar varios tipos de [employer]. Katha Hamming no son cancerosos. Muchos de ellos no causan problemas y con frecuencia desaparecen solos. Algunos pueden provocar sntomas y requerir TEFL teacher. Los tipos ms comunes de quistes ovricos son los siguientes:  Quistes funcionales: estos quistes pueden aparecer todos los meses durante el ciclo menstrual. Esto es normal. Estos quistes suelen desaparecer con el prximo ciclo menstrual si la mujer no queda embarazada. En general, los quistes funcionales no tienen sntomas.  Endometriomas: estos quistes se forman a partir del tejido que recubre el tero. Tambin se denominan "quistes de chocolate" porque se llenan de sangre que se vuelve marrn. Este tipo de quiste puede Psychologist, counselling en la zona inferior del abdomen durante la relacin sexual y con el perodo menstrual.  Cistoadenomas: este tipo se desarrolla a partir de las clulas que se [place] en el exterior del ovario. Estos quistes pueden ser muy grandes y causar dolor en la zona inferior del abdomen y durante la relacin sexual. Cleda Clarks tipo de quiste puede girar sobre s  mismo, cortar el suministro de Retail buyer y causar un dolor intenso. Tambin se puede romper con facilidad y Physicist, medical.  Quistes dermoides: este tipo de quiste a veces se encuentra en ambos ovarios. Estos quistes pueden [employer] tipos de tejidos del organismo, como piel, dientes, pelo o TEFL teacher. Generalmente no tienen sntomas, a menos que sean [address]  Quistes tecalutenicos: aparecen cuando se produce demasiada cantidad de cierta hormona (gonadotropina corinica humana) que estimula en exceso al ovario para que produzca vulos. Esto es ms frecuente despus de procedimientos que ayudan a la concepcin de un beb (fertilizacin in vitro). CAUSAS   Los medicamentos para la fertilidad pueden provocar una afeccin mediante la cual se forman mltiples quistes de gran tamao en los ovarios. Esta se denomina sndrome de hiperestimulacin ovrica.  El sndrome del ovario poliqustico es una afeccin que puede causar desequilibrios hormonales, los cuales pueden dar como resultado quistes ovricos no funcionales. SIGNOS Y SNTOMAS  Muchos quistes ovricos no causan sntomas. Si se presentan sntomas, stos pueden ser:  Dolor o molestias en la pelvis.  Dolor en la parte baja del abdomen.  Dolor durante las [employer].  Aumento del permetro abdominal (hinchazn).  Perodos menstruales anormales.  Aumento del The TJX Companiesdolor en los perodos [place]menstruales.  Cese de los perodos menstruales sin estar embarazada. DIAGNSTICO  Estos quistes se descubren comnmente durante un examen de rutina o una exploracin ginecolgica anual. Es posible que se ordenen otros estudios para obtener ms informacin sobre el Ludowiciquiste. Estos estudios pueden ser:  Regulatory affairs officercografas.  Radiografas de la pelvis.  Tomografa computada.  Resonancia magntica.  Anlisis de [place]ang[place]. TRATAMIENTO  Muchos de los quistes ovricos desaparecen por s solos, sin tratamiento. Es probable que el mdico quiera  controlar el quiste regularmente durante 2 o 3meses para ver si se produce algn cambio. En el caso de las mujeres en la menopausia, es particularmente importante controlar de cerca al quiste ya que el ndice de cncer de ovario en las mujeres menopusicas es ms alto. Cuando se requiere TEFL teachertratamiento, este puede incluir cualquiera de los siguientes:  Un procedimiento para drenar el quiste (aspiracin). Esto se puede realizar State Street Corporationmediante el uso de [place]una aguja grande y [place]una ecografa. Tambin se puede hacer a travs de un procedimiento laparoscpico, En este procedimiento, se inserta un tubo delgado que emite luz y que tiene una pequea cmara en un extremo (laparoscopio) a travs de una pequea incisin.  Ciruga para extirpar el quiste completo. Esto se puede realizar mediante una ciruga laparoscpica o Neomia Dearuna ciruga abierta, la cual implica realizar una incisin ms grande en la parte inferior del abdomen.  Tratamiento hormonal o pldoras anticonceptivas. Estos mtodos a veces se usan para ayudar a Baristadisolver un quiste. INSTRUCCIONES PARA EL CUIDADO EN EL HOGAR   Tome solo medicamentos de venta libre o recetados, segn las indicaciones del mdico.  OceanographerConcurra a las consultas de control con su mdico segn las indicaciones.  Hgase exmenes plvicos regulares y pruebas de Papanicolaou. SOLICITE ATENCIN MDICA SI:   Los perodos se atrasan, son irregulares, dolorosos o cesan.  El dolor plvico o abdominal no desaparece.  El abdomen se agranda o se hincha.  Siente presin en la vejiga o no puede vaciarla completamente.  Siente dolor durante las The St. Paul Travelersrelaciones sexuales.  Tiene una sensacin de hinchazn, presin o Environmental managermolestias en el estmago.  Pierde peso sin razn aparente.  Siente un Engineer, maintenance (IT)malestar generalizado.  Est estreida.  Pierde el apetito.  Le aparece acn.  Nota un aumento del vello corporal y facial.  Lenora BoysAumenta de peso sin hacer modificaciones en su actividad fsica y en su dieta  habitual.  Sospecha que est embarazada. SOLICITE ATENCIN MDICA DE INMEDIATO SI:   Siente cada vez ms dolor abdominal.  Tiene malestar estomacal (nuseas) y vomita.  Tiene fiebre que se presenta de Union Centermanera repentina.  Siente dolor abdominal al defecar.  Sus perodos menstruales son ms abundantes que lo habitual. ASEGRESE DE QUE:   Comprende estas instrucciones.  Controlar su afeccin.  Recibir ayuda de inmediato si no mejora o si empeora. Document Released: 05/18/2005 Document Revised: 08/13/2013 [facility] Patient Information 2015 [place], [place]. This information is not intended to replace advice given to you by your health care provider. Make sure you discuss any questions you have with your health care provider.

## 2014-12-04 NOTE — Progress Notes (Signed)
Subjective:     Patient ID: Abigail Rubio, female   DOB: 14-Nov-1980, 34 y.o.   MRN: [id number]  HPI LMP 2 weeks prev Pt reports that for 2 months she has had pelvic pain and back pain.  She reprots that the sx imporve with NSAIDS.  Pain worse with intercourse.  Pain is much better than when she was in the ED.    Pain worse with activity especially bending or lifting.  She denies fever or chills.     Review of Systems     Objective:   Physical Exam BP 112/79 mmHg  Pulse 69  Ht 4' 4.5" (1.334 m)  Wt 170 lb 9.6 oz (77.384 kg)  BMI 43.49 kg/m2 Pt in NAD Lungs: CTA CV: RRR Abd: soft, NT, ND GU: EGBUS: no lesions Vagina: no blood in vault Cervix: no lesion; no mucopurulent d/c; no CMT Uterus: small, mobile Adnexa: no masses; tender on left side appears to be directly over ovary on that side          10/15/2014 CLINICAL DATA: Pelvic and lower abdominal pain. Low back pain. Symptoms for 5 days.  EXAM: TRANSABDOMINAL AND TRANSVAGINAL ULTRASOUND OF PELVIS  DOPPLER ULTRASOUND OF OVARIES  TECHNIQUE: Both transabdominal and transvaginal ultrasound examinations of the pelvis were performed. Transabdominal technique was performed for global imaging of the pelvis including uterus, ovaries, adnexal regions, and pelvic cul-de-sac.  It was necessary to proceed with endovaginal exam following the transabdominal exam to visualize the uterus, endometrium and ovaries. Color and duplex Doppler ultrasound was utilized to evaluate blood flow to the ovaries.  COMPARISON: None.  FINDINGS: Uterus  Measurements: 7.2 x 4.5 x 5.3 cm. The uterus is retroverted. Echotexture is heterogeneous, however no fibroids or other mass visualized.  Endometrium  Thickness: 2.4 mm. No focal abnormality visualized.  Right ovary  Measurements: 2.8 x 1.4 x 2.2 cm. Normal appearance/no adnexal mass. Normal blood flow is seen.  Left ovary  Measurements: 3.1 x 2.1 x 2.3  cm. There is a 1.8 x 1.8 x 2.2 cm simple cyst. Normal blood flow seen to the ovarian parenchyma.  Pulsed Doppler evaluation of both ovaries demonstrates normal low-resistance arterial and venous waveforms.  Other findings  Trace free fluid.  IMPRESSION: 1. Retrograde uterus with heterogeneous echotexture, no discrete fibroid is seen. 2. Simple 2.2 cm cyst in the left ovary. This is almost certainly benign, and no specific imaging follow up is recommended according to the Society of Radiologists in Ultrasound2010 Consensus Conference Statement (D Lenis NoonLevine et al. Management of Asymptomatic Ovarian and Other Adnexal Cysts Imaged at US: Society of Radiologists in Ultrasound Consensus Conference Statement 2010. Radiology 256 (Sept 2010): 943-954.). Assessment:     pelvic pain- female.  Thought due to ov cyst.  Although this is a small cyst, on exam it does appear to be the area of the pain.  D/w pt observation vs OCPs .  She was on OCPs prior to this dx and she wants to continue  .    contraception counseling- no contraindications to OCPs. Plan:     F/u in 3 months Sprintec 1 po q day Motrin prn pain

## 2014-12-10 ENCOUNTER — Encounter: Payer: Self-pay | Admitting: *Deleted

## 2015-03-11 ENCOUNTER — Inpatient Hospital Stay (HOSPITAL_COMMUNITY): Payer: Self-pay

## 2015-03-11 ENCOUNTER — Inpatient Hospital Stay (HOSPITAL_COMMUNITY)
Admission: AD | Admit: 2015-03-11 | Discharge: 2015-03-11 | Disposition: A | Discharge status: Home / Self Care | Payer: Self-pay | Source: Ambulatory Visit | Attending: Obstetrics and Gynecology | Admitting: Obstetrics and Gynecology

## 2015-03-11 ENCOUNTER — Encounter (HOSPITAL_COMMUNITY): Payer: Self-pay

## 2015-03-11 DIAGNOSIS — Z3A16 16 weeks gestation of pregnancy: Secondary | ICD-10-CM | POA: Insufficient documentation

## 2015-03-11 DIAGNOSIS — M79601 Pain in right arm: Secondary | ICD-10-CM | POA: Insufficient documentation

## 2015-03-11 DIAGNOSIS — O9989 Other specified diseases and conditions complicating pregnancy, childbirth and the puerperium: Secondary | ICD-10-CM

## 2015-03-11 DIAGNOSIS — O26899 Other specified pregnancy related conditions, unspecified trimester: Secondary | ICD-10-CM

## 2015-03-11 DIAGNOSIS — O26852 Spotting complicating pregnancy, second trimester: Secondary | ICD-10-CM | POA: Insufficient documentation

## 2015-03-11 DIAGNOSIS — R109 Unspecified abdominal pain: Secondary | ICD-10-CM | POA: Insufficient documentation

## 2015-03-11 DIAGNOSIS — O4690 Antepartum hemorrhage, unspecified, unspecified trimester: Secondary | ICD-10-CM | POA: Insufficient documentation

## 2015-03-11 LAB — URINALYSIS, ROUTINE W REFLEX MICROSCOPIC
BILIRUBIN URINE: NEGATIVE
GLUCOSE, UA: NEGATIVE mg/dL
KETONES UR: NEGATIVE mg/dL
LEUKOCYTES UA: NEGATIVE
NITRITE: NEGATIVE
Protein, ur: NEGATIVE mg/dL
Specific Gravity, Urine: >1.03 — ABNORMAL HIGH (ref 1.005–1.030)
Urobilinogen, UA: 0.2 mg/dL (ref 0.0–1.0)
pH: 6 (ref 5.0–8.0)

## 2015-03-11 LAB — URINE MICROSCOPIC-ADD ON

## 2015-03-11 LAB — POCT PREGNANCY, URINE: Preg Test, Ur: POSITIVE — AB

## 2015-03-11 LAB — WET PREP, GENITAL
CLUE CELLS WET PREP: NONE SEEN
Trich, Wet Prep: NONE SEEN
Yeast Wet Prep HPF POC: NONE SEEN

## 2015-03-11 MED ORDER — CYCLOBENZAPRINE HCL 10 MG PO TABS: 10.0000 mg | ORAL_TABLET | Freq: Two times a day (BID) | ORAL | Status: DC | PRN

## 2015-03-11 MED ORDER — ACETAMINOPHEN 325 MG PO TABS
650.0000 mg | ORAL_TABLET | Freq: Once | ORAL | Status: AC
  Administered 2015-03-11: 650 mg via ORAL
  Filled 2015-03-11: qty 2

## 2015-03-11 NOTE — Discharge Instructions (Signed)
Recommend maternity belt You can take muscle relaxer for pain and/or Tylenol

## 2015-03-11 NOTE — MAU Note (Signed)
Right arm pain x 1 month swelling in wrist area, [facility]

## 2015-03-11 NOTE — MAU Provider Note (Signed)
History     CSN: [id number]  Arrival date and time: 03/11/15 4403   None     Chief Complaint  Patient presents with  . Arm Pain    right arm pain x 1 month   HPI Pt is [id number] at [redacted]w[redacted]d pregnant who presents with lower abd pain and spotting when wiping going to the bathroom. Pt's pain if lower midline where low transverse scar is located Pt also c/o of right arm swelling- right wrist to shoulder Pt has taken Tylenol with some relief. Pt plans to get [facility] at [facility]- to call today for an appointment Pt denies other vaginal discharge, UTI sx or constipation/diarrhea RN note: Right arm pain x 1 month swelling in wrist area, [facility] 08/24/15 from [facility]         Past Medical History  Diagnosis Date  . No pertinent past medical history   . Asthma   . H/O postpartum depression, currently pregnant   . Abnormal Pap smear   . Language barrier   . History of positive PPD 2007    negative chest X ray completed tx 2007  . Asthma 1980  . Abnormal Pap smear 2004    during 1st pregnancy.   . Depression   . Trauma to ear     left  . Anemia   . Hx of hepatitis   . GERD (gastroesophageal reflux disease)   . Late prenatal care   . History of positive PPD     negative CXR    Past Surgical History  Procedure Laterality Date  . Cesarean section    . Appendectomy    . Gynecologic cryosurgery    . Colposcopy  2004  . Apendectomy  2003  . Cesarean section  2004    Family History  Problem Relation Age of Onset  . Cancer Maternal Aunt     breast  . Heart disease Maternal Aunt   . Cancer Maternal Uncle     prostate  . Hypertension Maternal Grandmother   . Hyperlipidemia Maternal Grandfather   . Kidney disease Sister   . Urolithiasis Sister   . Anemia Mother   . Birth defects Maternal Uncle     hole in heart  . Anemia Maternal Grandmother   . Diabetes Maternal Grandfather     History  Substance Use Topics  . Smoking status: Passive Smoke Exposure - Never Smoker  .  Smokeless tobacco: Never Used  . Alcohol Use: No    Allergies:  Allergies  Allergen Reactions  . Pork-Derived Products Nausea And Vomiting    Prescriptions prior to admission  Medication Sig Dispense Refill Last Dose  . acetaminophen (TYLENOL) 500 MG tablet Take 500 mg by mouth every 6 (six) hours as needed.   03/10/2015 at Unknown time  . Prenatal Vit-Fe Fumarate-FA (PRENATAL MULTIVITAMIN) TABS tablet Take 1 tablet by mouth daily at 12 noon.   03/10/2015 at Unknown time  . ibuprofen (ADVIL,MOTRIN) 600 MG tablet Take 1 tablet (600 mg total) by mouth every 6 (six) hours as needed. (Patient not taking: Reported on 03/11/2015) 60 tablet 1   . norgestimate-ethinyl estradiol (ORTHO-CYCLEN,SPRINTEC,PREVIFEM) 0.25-35 MG-MCG tablet Take 1 tablet by mouth daily. (Patient not taking: Reported on 03/11/2015) 1 Package 11     Review of Systems  Gastrointestinal: Positive for abdominal pain. Negative for diarrhea and constipation.  Genitourinary: Negative for dysuria.       + spotting with wiping when going to bathroom  Musculoskeletal: Positive for joint pain.  Right arm pain with swelling radiating towards shoulder   Physical Exam   Blood pressure 115/61, pulse 66, temperature 97.7 F (36.5 C), temperature source Oral, resp. rate 18, height 5\' 4"  (1.626 m), weight 170 lb 12.8 oz (77.474 kg), last menstrual period 09/23/2014, unknown if currently breastfeeding.  Physical Exam  Nursing note and vitals reviewed. Constitutional: She is oriented to person, place, and time. She appears well-developed and well-nourished.  Uncomfortable appearing  HENT:  Head: Normocephalic.  Eyes: Pupils are equal, round, and reactive to light.  Neck: Normal range of motion. Neck supple.  Cardiovascular: Normal rate.   Respiratory: Effort normal.  GI: Soft. She exhibits no distension. There is tenderness. There is guarding. There is no rebound.  FHR 152 with doppler Diffuse lower abd pain with palpation   Genitourinary:  BUS negative; small amount of creamy white discharge in vault; cervix clean, closed, NT; uterus gravid AGA FHR 152 with doppler Mild lower abd tenderness  Musculoskeletal: Normal range of motion.  Right wrist edema and tenderness  Neurological: She is alert and oriented to person, place, and time.  Skin: Skin is warm.  Psychiatric: She has a normal mood and affect.    MAU Course  Procedures Results for orders placed or performed during the hospital encounter of 03/11/15 (from the past 24 hour(s))  Pregnancy, urine POC     Status: Abnormal   Collection Time: 03/11/15  8:47 AM  Result Value Ref Range   Preg Test, Ur POSITIVE (A) NEGATIVE  Urinalysis, Routine w reflex microscopic ([facility])     Status: Abnormal   Collection Time: 03/11/15 10:07 AM  Result Value Ref Range   Color, Urine YELLOW YELLOW   APPearance CLEAR CLEAR   Specific Gravity, Urine >1.030 (H) 1.005 - 1.030   pH 6.0 5.0 - 8.0   Glucose, UA NEGATIVE NEGATIVE mg/dL   Hgb urine dipstick TRACE (A) NEGATIVE   Bilirubin Urine NEGATIVE NEGATIVE   Ketones, ur NEGATIVE NEGATIVE mg/dL   Protein, ur NEGATIVE NEGATIVE mg/dL   Urobilinogen, UA 0.2 0.0 - 1.0 mg/dL   Nitrite NEGATIVE NEGATIVE   Leukocytes, UA NEGATIVE NEGATIVE  Urine microscopic-add on     Status: Abnormal   Collection Time: 03/11/15 10:07 AM  Result Value Ref Range   Squamous Epithelial / LPF MANY (A) RARE   WBC, UA 0-2 <3 WBC/hpf   RBC / HPF 3-6 <3 RBC/hpf   Bacteria, UA FEW (A) RARE   Urine-Other MUCOUS PRESENT   Wet prep, genital     Status: Abnormal   Collection Time: 03/11/15 10:08 AM  Result Value Ref Range   Yeast Wet Prep HPF POC NONE SEEN NONE SEEN   Trich, Wet Prep NONE SEEN NONE SEEN   Clue Cells Wet Prep HPF POC NONE SEEN NONE SEEN   WBC, Wet Prep HPF POC MANY (A) NONE SEEN  No results found. Limited US: SLIUP FHR 138 bpm Placenta posterior above cervical os; cervical length 3.3cm abd pain resolved while pt in  MAU  Assessment and Plan  Abdominal pain in pregnancy- resolved while in MAU Spotting in pregnancy- no evidence on exam today SLIUP with normal limited ultrasound findings Recommend abdominal binder/maternity belt F/u with prenatal care Return to MAU with increase in pain or bleeding or concerns  Jaccob Czaplicki 03/11/2015, 8:57 AM

## 2015-03-12 LAB — GC/CHLAMYDIA PROBE AMP (~~LOC~~) NOT AT ARMC
Chlamydia: NEGATIVE
Neisseria Gonorrhea: NEGATIVE

## 2015-03-13 LAB — URINE CULTURE: Culture: 4000

## 2015-04-20 ENCOUNTER — Other Ambulatory Visit (HOSPITAL_COMMUNITY): Payer: Self-pay | Admitting: Urology

## 2015-04-20 DIAGNOSIS — Z3689 Encounter for other specified antenatal screening: Secondary | ICD-10-CM

## 2015-04-20 DIAGNOSIS — O0932 Supervision of pregnancy with insufficient antenatal care, second trimester: Secondary | ICD-10-CM

## 2015-04-20 DIAGNOSIS — Z3A23 23 weeks gestation of pregnancy: Secondary | ICD-10-CM

## 2015-04-20 LAB — OB RESULTS CONSOLE ANTIBODY SCREEN: Antibody Screen: NEGATIVE

## 2015-04-20 LAB — OB RESULTS CONSOLE GC/CHLAMYDIA
CHLAMYDIA, DNA PROBE: NEGATIVE
GC PROBE AMP, GENITAL: NEGATIVE

## 2015-04-20 LAB — OB RESULTS CONSOLE ABO/RH: RH Type: POSITIVE

## 2015-04-20 LAB — OB RESULTS CONSOLE RPR: RPR: NONREACTIVE

## 2015-04-20 LAB — OB RESULTS CONSOLE HIV ANTIBODY (ROUTINE TESTING): HIV: NONREACTIVE

## 2015-04-20 LAB — OB RESULTS CONSOLE HEPATITIS B SURFACE ANTIGEN: Hepatitis B Surface Ag: NEGATIVE

## 2015-04-20 LAB — OB RESULTS CONSOLE RUBELLA ANTIBODY, IGM: Rubella: IMMUNE

## 2015-05-05 ENCOUNTER — Other Ambulatory Visit (HOSPITAL_COMMUNITY): Payer: Self-pay | Admitting: Urology

## 2015-05-05 ENCOUNTER — Ambulatory Visit (HOSPITAL_COMMUNITY)
Admission: RE | Admit: 2015-05-05 | Discharge: 2015-05-05 | Disposition: A | Discharge status: Home / Self Care | Payer: Self-pay | Source: Ambulatory Visit | Attending: Physician Assistant | Admitting: Physician Assistant

## 2015-05-05 DIAGNOSIS — Z3689 Encounter for other specified antenatal screening: Secondary | ICD-10-CM

## 2015-05-05 DIAGNOSIS — Z36 Encounter for antenatal screening of mother: Secondary | ICD-10-CM | POA: Insufficient documentation

## 2015-05-05 DIAGNOSIS — O0932 Supervision of pregnancy with insufficient antenatal care, second trimester: Secondary | ICD-10-CM | POA: Insufficient documentation

## 2015-05-05 DIAGNOSIS — Z3A23 23 weeks gestation of pregnancy: Secondary | ICD-10-CM

## 2015-05-22 ENCOUNTER — Other Ambulatory Visit (HOSPITAL_COMMUNITY): Payer: Self-pay | Admitting: Urology

## 2015-05-22 DIAGNOSIS — Z0489 Encounter for examination and observation for other specified reasons: Secondary | ICD-10-CM

## 2015-05-22 DIAGNOSIS — IMO0002 Reserved for concepts with insufficient information to code with codable children: Secondary | ICD-10-CM

## 2015-05-27 ENCOUNTER — Other Ambulatory Visit (HOSPITAL_COMMUNITY): Payer: Self-pay | Admitting: Urology

## 2015-05-27 ENCOUNTER — Ambulatory Visit (HOSPITAL_COMMUNITY)
Admission: RE | Admit: 2015-05-27 | Discharge: 2015-05-27 | Disposition: A | Discharge status: Home / Self Care | Payer: Self-pay | Source: Ambulatory Visit | Attending: Orthopedic Surgery | Admitting: Orthopedic Surgery

## 2015-05-27 ENCOUNTER — Encounter (HOSPITAL_COMMUNITY): Payer: Self-pay

## 2015-05-27 DIAGNOSIS — Z3A26 26 weeks gestation of pregnancy: Secondary | ICD-10-CM | POA: Insufficient documentation

## 2015-05-27 DIAGNOSIS — Z36 Encounter for antenatal screening of mother: Secondary | ICD-10-CM | POA: Insufficient documentation

## 2015-05-27 DIAGNOSIS — Z0489 Encounter for examination and observation for other specified reasons: Secondary | ICD-10-CM

## 2015-05-27 DIAGNOSIS — O34219 Maternal care for unspecified type scar from previous cesarean delivery: Secondary | ICD-10-CM

## 2015-05-27 DIAGNOSIS — IMO0002 Reserved for concepts with insufficient information to code with codable children: Secondary | ICD-10-CM

## 2015-08-03 LAB — OB RESULTS CONSOLE GBS: GBS: POSITIVE

## 2015-08-03 LAB — OB RESULTS CONSOLE GC/CHLAMYDIA
CHLAMYDIA, DNA PROBE: NEGATIVE
GC PROBE AMP, GENITAL: NEGATIVE

## 2015-08-23 NOTE — L&D Delivery Note (Signed)
Patient is 35 y.o. [id number] 9171w0d admitted for PD-IOL, hx of postpartum depression, anemia, CS with 3 VBACs after. Her cervix was favorable and she was started on pitocin. AROM performed and patient progressed normally.   Delivery Note At 6:58 PM a viable female was delivered via Vaginal, Spontaneous Delivery (Presentation: ; Occiput Anterior).  APGAR: 8, 9; weight-pending  Placenta status: Intact, Spontaneous.  Cord: 3 vessels with the following complications: None.  Cord pH: -not collected  Anesthesia: None  Episiotomy: None Lacerations: None Suture Repair: na Est. Blood Loss (mL): 50  Mom to postpartum.  Baby to Couplet care / Skin to Skin.  Isa RankinKimberly Niles [facility] 09/07/2015, 7:11 PM

## 2015-09-01 ENCOUNTER — Other Ambulatory Visit (HOSPITAL_COMMUNITY): Payer: Self-pay | Admitting: Nurse Practitioner

## 2015-09-01 DIAGNOSIS — O480 Post-term pregnancy: Secondary | ICD-10-CM

## 2015-09-02 ENCOUNTER — Ambulatory Visit (HOSPITAL_COMMUNITY)
Admission: RE | Admit: 2015-09-02 | Discharge: 2015-09-02 | Disposition: A | Discharge status: Home / Self Care | Payer: Self-pay | Source: Ambulatory Visit | Attending: Physician Assistant | Admitting: Physician Assistant

## 2015-09-02 ENCOUNTER — Encounter (HOSPITAL_COMMUNITY): Payer: Self-pay | Admitting: *Deleted

## 2015-09-02 ENCOUNTER — Telehealth (HOSPITAL_COMMUNITY): Payer: Self-pay | Admitting: *Deleted

## 2015-09-02 DIAGNOSIS — Z3A40 40 weeks gestation of pregnancy: Secondary | ICD-10-CM | POA: Insufficient documentation

## 2015-09-02 DIAGNOSIS — O480 Post-term pregnancy: Secondary | ICD-10-CM | POA: Insufficient documentation

## 2015-09-02 DIAGNOSIS — O34219 Maternal care for unspecified type scar from previous cesarean delivery: Secondary | ICD-10-CM | POA: Insufficient documentation

## 2015-09-02 NOTE — Telephone Encounter (Signed)
Preadmission screen  

## 2015-09-02 NOTE — Telephone Encounter (Signed)
Interpreter number [phone]

## 2015-09-04 ENCOUNTER — Other Ambulatory Visit (HOSPITAL_COMMUNITY): Payer: Self-pay | Admitting: Nurse Practitioner

## 2015-09-04 DIAGNOSIS — O480 Post-term pregnancy: Secondary | ICD-10-CM

## 2015-09-05 ENCOUNTER — Other Ambulatory Visit: Payer: Self-pay | Admitting: Advanced Practice Midwife

## 2015-09-07 ENCOUNTER — Inpatient Hospital Stay (HOSPITAL_COMMUNITY)
Admission: RE | Admit: 2015-09-07 | Discharge: 2015-09-09 | DRG: 775 | Disposition: A | Discharge status: Home / Self Care | Payer: Medicaid Other | Source: Ambulatory Visit | Attending: Family Medicine | Admitting: Family Medicine

## 2015-09-07 ENCOUNTER — Encounter (HOSPITAL_COMMUNITY): Payer: Self-pay

## 2015-09-07 DIAGNOSIS — O99824 Streptococcus B carrier state complicating childbirth: Secondary | ICD-10-CM | POA: Diagnosis present

## 2015-09-07 DIAGNOSIS — Z7722 Contact with and (suspected) exposure to environmental tobacco smoke (acute) (chronic): Secondary | ICD-10-CM | POA: Diagnosis present

## 2015-09-07 DIAGNOSIS — Z803 Family history of malignant neoplasm of breast: Secondary | ICD-10-CM

## 2015-09-07 DIAGNOSIS — E669 Obesity, unspecified: Secondary | ICD-10-CM | POA: Diagnosis present

## 2015-09-07 DIAGNOSIS — O9952 Diseases of the respiratory system complicating childbirth: Secondary | ICD-10-CM | POA: Diagnosis present

## 2015-09-07 DIAGNOSIS — O34211 Maternal care for low transverse scar from previous cesarean delivery: Secondary | ICD-10-CM | POA: Diagnosis present

## 2015-09-07 DIAGNOSIS — Z833 Family history of diabetes mellitus: Secondary | ICD-10-CM

## 2015-09-07 DIAGNOSIS — Z841 Family history of disorders of kidney and ureter: Secondary | ICD-10-CM

## 2015-09-07 DIAGNOSIS — O99214 Obesity complicating childbirth: Secondary | ICD-10-CM | POA: Diagnosis present

## 2015-09-07 DIAGNOSIS — Z3A41 41 weeks gestation of pregnancy: Secondary | ICD-10-CM

## 2015-09-07 DIAGNOSIS — D649 Anemia, unspecified: Secondary | ICD-10-CM | POA: Diagnosis present

## 2015-09-07 DIAGNOSIS — J45909 Unspecified asthma, uncomplicated: Secondary | ICD-10-CM | POA: Diagnosis present

## 2015-09-07 DIAGNOSIS — O34219 Maternal care for unspecified type scar from previous cesarean delivery: Secondary | ICD-10-CM | POA: Diagnosis present

## 2015-09-07 DIAGNOSIS — O480 Post-term pregnancy: Secondary | ICD-10-CM | POA: Diagnosis present

## 2015-09-07 DIAGNOSIS — Z3490 Encounter for supervision of normal pregnancy, unspecified, unspecified trimester: Secondary | ICD-10-CM

## 2015-09-07 DIAGNOSIS — O9902 Anemia complicating childbirth: Secondary | ICD-10-CM | POA: Diagnosis present

## 2015-09-07 DIAGNOSIS — Z8249 Family history of ischemic heart disease and other diseases of the circulatory system: Secondary | ICD-10-CM

## 2015-09-07 DIAGNOSIS — Z8349 Family history of other endocrine, nutritional and metabolic diseases: Secondary | ICD-10-CM

## 2015-09-07 LAB — CBC
HCT: 33.7 % — ABNORMAL LOW (ref 36.0–46.0)
Hemoglobin: 11.1 g/dL — ABNORMAL LOW (ref 12.0–15.0)
MCH: 28.4 pg (ref 26.0–34.0)
MCHC: 32.9 g/dL (ref 30.0–36.0)
MCV: 86.2 fL (ref 78.0–100.0)
PLATELETS: 293 10*3/uL (ref 150–400)
RBC: 3.91 MIL/uL (ref 3.87–5.11)
RDW: 14.6 % (ref 11.5–15.5)
WBC: 8.9 10*3/uL (ref 4.0–10.5)

## 2015-09-07 LAB — TYPE AND SCREEN
ABO/RH(D): O POS
Antibody Screen: NEGATIVE

## 2015-09-07 MED ORDER — FENTANYL CITRATE (PF) 100 MCG/2ML IJ SOLN: 100.0000 ug | Freq: Once | INTRAMUSCULAR | Status: DC

## 2015-09-07 MED ORDER — FLEET ENEMA 7-19 GM/118ML RE ENEM: 1.0000 | ENEMA | RECTAL | Status: DC | PRN

## 2015-09-07 MED ORDER — CITRIC ACID-SODIUM CITRATE 334-500 MG/5ML PO SOLN: 30.0000 mL | ORAL | Status: DC | PRN

## 2015-09-07 MED ORDER — INFLUENZA VAC SPLIT QUAD 0.5 ML IM SUSY
0.5000 mL | PREFILLED_SYRINGE | INTRAMUSCULAR | Status: AC
  Administered 2015-09-09: 0.5 mL via INTRAMUSCULAR
  Filled 2015-09-07: qty 0.5

## 2015-09-07 MED ORDER — DIPHENHYDRAMINE HCL 25 MG PO CAPS: 25.0000 mg | ORAL_CAPSULE | Freq: Four times a day (QID) | ORAL | Status: DC | PRN

## 2015-09-07 MED ORDER — LACTATED RINGERS IV SOLN
2.5000 [IU]/h | INTRAVENOUS | Status: DC
  Administered 2015-09-07: 39.96 [IU]/h via INTRAVENOUS
  Filled 2015-09-07: qty 4

## 2015-09-07 MED ORDER — BENZOCAINE-MENTHOL 20-0.5 % EX AERO
1.0000 "application " | INHALATION_SPRAY | CUTANEOUS | Status: DC | PRN
  Administered 2015-09-08: 1 via TOPICAL
  Filled 2015-09-07 (×2): qty 56

## 2015-09-07 MED ORDER — ACETAMINOPHEN 325 MG PO TABS: 650.0000 mg | ORAL_TABLET | ORAL | Status: DC | PRN

## 2015-09-07 MED ORDER — LACTATED RINGERS IV SOLN: 500.0000 mL | INTRAVENOUS | Status: DC | PRN

## 2015-09-07 MED ORDER — FENTANYL CITRATE (PF) 100 MCG/2ML IJ SOLN
INTRAMUSCULAR | Status: AC
  Administered 2015-09-07: 100 ug
  Filled 2015-09-07: qty 2

## 2015-09-07 MED ORDER — PENICILLIN G POTASSIUM 5000000 UNITS IJ SOLR
2.5000 10*6.[IU] | INTRAMUSCULAR | Status: DC
  Administered 2015-09-07: 2.5 10*6.[IU] via INTRAVENOUS
  Filled 2015-09-07 (×4): qty 2.5

## 2015-09-07 MED ORDER — TERBUTALINE SULFATE 1 MG/ML IJ SOLN: 0.2500 mg | Freq: Once | INTRAMUSCULAR | Status: DC | PRN

## 2015-09-07 MED ORDER — LACTATED RINGERS IV SOLN
INTRAVENOUS | Status: DC
  Administered 2015-09-07: 12:00:00 via INTRAVENOUS

## 2015-09-07 MED ORDER — PRENATAL MULTIVITAMIN CH
1.0000 | ORAL_TABLET | Freq: Every day | ORAL | Status: DC
  Administered 2015-09-08 – 2015-09-09 (×2): 1 via ORAL
  Filled 2015-09-07 (×2): qty 1

## 2015-09-07 MED ORDER — LIDOCAINE HCL (PF) 1 % IJ SOLN
30.0000 mL | INTRAMUSCULAR | Status: DC | PRN
  Filled 2015-09-07: qty 30

## 2015-09-07 MED ORDER — FENTANYL CITRATE (PF) 100 MCG/2ML IJ SOLN
100.0000 ug | INTRAMUSCULAR | Status: DC | PRN
  Administered 2015-09-07: 100 ug via INTRAVENOUS
  Filled 2015-09-07: qty 2

## 2015-09-07 MED ORDER — ONDANSETRON HCL 4 MG PO TABS
4.0000 mg | ORAL_TABLET | ORAL | Status: DC | PRN
Start: 2015-09-07 — End: 2015-09-09

## 2015-09-07 MED ORDER — IBUPROFEN 600 MG PO TABS
600.0000 mg | ORAL_TABLET | Freq: Four times a day (QID) | ORAL | Status: DC
  Administered 2015-09-07 – 2015-09-09 (×8): 600 mg via ORAL
  Filled 2015-09-07 (×8): qty 1

## 2015-09-07 MED ORDER — DOCUSATE SODIUM 100 MG PO CAPS
100.0000 mg | ORAL_CAPSULE | Freq: Two times a day (BID) | ORAL | Status: DC
  Administered 2015-09-07 – 2015-09-09 (×4): 100 mg via ORAL
  Filled 2015-09-07 (×4): qty 1

## 2015-09-07 MED ORDER — ACETAMINOPHEN 325 MG PO TABS
650.0000 mg | ORAL_TABLET | ORAL | Status: DC | PRN
  Administered 2015-09-07 – 2015-09-08 (×3): 650 mg via ORAL
  Filled 2015-09-07 (×3): qty 2

## 2015-09-07 MED ORDER — SIMETHICONE 80 MG PO CHEW: 80.0000 mg | CHEWABLE_TABLET | ORAL | Status: DC | PRN

## 2015-09-07 MED ORDER — OXYTOCIN 10 UNIT/ML IJ SOLN
1.0000 m[IU]/min | INTRAVENOUS | Status: DC
  Administered 2015-09-07: 2 m[IU]/min via INTRAVENOUS

## 2015-09-07 MED ORDER — OXYCODONE-ACETAMINOPHEN 5-325 MG PO TABS: 2.0000 | ORAL_TABLET | ORAL | Status: DC | PRN

## 2015-09-07 MED ORDER — DIBUCAINE 1 % RE OINT
1.0000 | TOPICAL_OINTMENT | RECTAL | Status: DC | PRN
Start: 2015-09-07 — End: 2015-09-09
  Filled 2015-09-07: qty 28

## 2015-09-07 MED ORDER — LANOLIN HYDROUS EX OINT: TOPICAL_OINTMENT | CUTANEOUS | Status: DC | PRN

## 2015-09-07 MED ORDER — OXYCODONE-ACETAMINOPHEN 5-325 MG PO TABS: 1.0000 | ORAL_TABLET | ORAL | Status: DC | PRN

## 2015-09-07 MED ORDER — ONDANSETRON HCL 4 MG/2ML IJ SOLN: 4.0000 mg | Freq: Four times a day (QID) | INTRAMUSCULAR | Status: DC | PRN

## 2015-09-07 MED ORDER — OXYTOCIN BOLUS FROM INFUSION: 500.0000 mL | INTRAVENOUS | Status: DC

## 2015-09-07 MED ORDER — PNEUMOCOCCAL VAC POLYVALENT 25 MCG/0.5ML IJ INJ
0.5000 mL | INJECTION | INTRAMUSCULAR | Status: DC
  Filled 2015-09-07: qty 0.5

## 2015-09-07 MED ORDER — DEXTROSE 5 % IV SOLN
5.0000 10*6.[IU] | Freq: Once | INTRAVENOUS | Status: AC
  Administered 2015-09-07: 5 10*6.[IU] via INTRAVENOUS
  Filled 2015-09-07: qty 5

## 2015-09-07 MED ORDER — WITCH HAZEL-GLYCERIN EX PADS
1.0000 | MEDICATED_PAD | CUTANEOUS | Status: DC | PRN
Start: 2015-09-07 — End: 2015-09-09

## 2015-09-07 MED ORDER — ONDANSETRON HCL 4 MG/2ML IJ SOLN: 4.0000 mg | INTRAMUSCULAR | Status: DC | PRN

## 2015-09-07 NOTE — Progress Notes (Signed)
Labor Progress Note Carollee LeitzMariana Llera is a 35 y.o. [id number] at 7771w0d presented for PD- IOL  S: On pitocin and feeling contractions.  S/p Fentanyl.   O:  BP 124/80 mmHg  Pulse 76  Resp 18  LMP 11/14/2014 EFM: 130/mod/+accels  CVE: Dilation: 5 Effacement (%): 70 Cervical Position: Posterior Station: -2 Presentation: Vertex Exam by:: Dr Alvester MorinNewton  AROM- light meconium  A&P: 35 y.o. [id number] 6671w0d here for postdates #Labor: AROM performed. Pitocin @ 10 #Pain: s/p fentanyl. Prn IV meds.  #FWB: Cat I #GBS  POS, PCN infusion > 4hrs  Federico FlakeKimberly Niles Rylon Poitra, MD 5:34 PM

## 2015-09-07 NOTE — H&P (Signed)
OBSTETRIC ADMISSION HISTORY AND PHYSICAL  Abigail Rubio is a 35 y.o. female [phone] with IUP at [redacted]w[redacted]d by L/23 presenting for postdates pregnancy. She reports +FMs, No LOF, no VB, no blurry vision, headaches or peripheral edema, and RUQ pain.  She plans on breastfeeding. She request depo for birth control.  Dating: By L/23 --->  Estimated Date of Delivery: 08/31/15  Complicated by 1) GBS pos 2) CSx1 with VBAC x3 3) h/o postpartum depression 4) asthma 5) obesity 6) anemia  Prenatal History/Complications:  Past Medical History: Past Medical History  Diagnosis Date  . Asthma   . Language barrier   . History of positive PPD 2007    negative chest X ray completed tx 2007  . Asthma 1980  . Trauma to ear     left  . Anemia   . Hx of hepatitis   . GERD (gastroesophageal reflux disease)   . Late prenatal care   . Vaginal Pap smear, abnormal   . Depression     PP  . Obesity     Past Surgical History: Past Surgical History  Procedure Laterality Date  . Cesarean section    . Appendectomy    . Gynecologic cryosurgery    . Colposcopy  2004  . Apendectomy  2003  . Cesarean section  2004    Obstetrical History: OB History    Gravida Para Term Preterm AB TAB SAB Ectopic Multiple Living   Social History: Social History   Social History  . Marital Status: Single    Spouse Name: N/A  . Number of Children: N/A  . Years of Education: N/A   Social History Main Topics  . Smoking status: Passive Smoke Exposure - Never Smoker  . Smokeless tobacco: Never Used  . Alcohol Use: No  . Drug Use: No  . Sexual Activity: Yes    Birth Control/ Protection: Pill, IUD   Other Topics Concern  . Not on file   Social History Narrative   ** Merged History Encounter **        Family History: Family History  Problem Relation Age of Onset  . Cancer Maternal Aunt     breast  . Heart disease Maternal Aunt   . Birth defects Maternal Uncle     hole in heart   . Hypertension Maternal Grandmother   . Anemia Maternal Grandmother   . Hyperlipidemia Maternal Grandfather   . Diabetes Maternal Grandfather   . Kidney disease Sister   . Urolithiasis Sister   . Anemia Mother     Allergies: Allergies  Allergen Reactions  . Pork-Derived Products Nausea And Vomiting    Prescriptions prior to admission  Medication Sig Dispense Refill Last Dose  . acetaminophen (TYLENOL) 500 MG tablet Take 1,000 mg by mouth every 6 (six) hours as needed for mild pain or headache.    Past Week at Unknown time  . cyclobenzaprine (FLEXERIL) 10 MG tablet Take 1 tablet (10 mg total) by mouth 2 (two) times daily as needed for muscle spasms. 20 tablet 0 09/06/2015 at Unknown time  . Prenatal Vit-Fe Fumarate-FA (PRENATAL MULTIVITAMIN) TABS tablet Take 1 tablet by mouth daily at 12 noon.   09/06/2015 at Unknown time     Review of Systems   All systems reviewed and negative except as stated in HPI  Last menstrual period 11/14/2014, unknown if currently breastfeeding. General appearance: alert, cooperative and appears stated age Lungs:  clear to auscultation bilaterally Heart: regular rate and rhythm Abdomen: soft, non-tender; bowel sounds normal Pelvic: adequate Extremities: Homans sign is negative, no sign of DVT  Presentation: cephalic Fetal monitoringBaseline: 135 bpm, Variability: Good {> 6 bpm) and Accelerations: Reactive Uterine activityFrequency: Every 5-7 minutes Dilation: 3 Effacement (%): 50 Station: -3 Exam by:: Dr Alvester Morin   Prenatal labs: ABO, Rh: O/Positive/-- (08/29 0000) Antibody: Negative (08/29 0000) Rubella: IMMUNE RPR: Nonreactive (08/29 0000)  HBsAg: Negative (08/29 0000)  HIV: Non-reactive (08/29 0000)  GBS: Positive (12/12 0000)  1 hr Glucola 82, neg Genetic screening  negative Anatomy US wnl  Prenatal Transfer Tool  Maternal Diabetes: No Genetic Screening: Normal Maternal Ultrasounds/Referrals: Normal Fetal Ultrasounds or other  Referrals:  None Maternal Substance Abuse:  No Significant Maternal Medications:  None Significant Maternal Lab Results: Lab values include: Group B Strep positive  Results for orders placed or performed during the hospital encounter of 09/07/15 (from the past 24 hour(s))  CBC   Collection Time: 09/07/15 12:00 PM  Result Value Ref Range   WBC 8.9 4.0 - 10.5 K/uL   RBC 3.91 3.87 - 5.11 MIL/uL   Hemoglobin 11.1 (L) 12.0 - 15.0 g/dL   HCT 16.1 (L) 09.6 - 04.5 %   MCV 86.2 78.0 - 100.0 fL   MCH 28.4 26.0 - 34.0 pg   MCHC 32.9 30.0 - 36.0 g/dL   RDW 40.9 81.1 - 91.4 %   Platelets 293 150 - 400 K/uL    Patient Active Problem List   Diagnosis Date Noted  . Pregnancy 09/07/2015  . Post term pregnancy, 41 weeks 09/07/2015  . Abdominal pain in pregnancy   . Previous cesarean delivery, antepartum condition or complication 12/21/2012    Assessment: Abigail Rubio is a 35 y.o. [id number] at [redacted]w[redacted]d here for postdates induction  #Labor: start pitocin, bishop score 7 #Pain: Prn meds if desired, has had 2 deliveries without medications #FWB: Cat I #ID:  GBS POS, start PCN #MOF:  Breast #MOC: Depo  #h/o postpartum depression: SW post partum, plan for baby love assessment  #Mode of Delivery: anticipate NSVD  Federico Flake 09/07/2015, 12:41 PM

## 2015-09-08 ENCOUNTER — Encounter (HOSPITAL_COMMUNITY): Payer: Self-pay

## 2015-09-08 LAB — RPR: RPR Ser Ql: NONREACTIVE

## 2015-09-08 MED ORDER — OXYCODONE HCL 5 MG PO TABS
5.0000 mg | ORAL_TABLET | Freq: Once | ORAL | Status: AC
  Administered 2015-09-08: 5 mg via ORAL
  Filled 2015-09-08: qty 1

## 2015-09-08 NOTE — Progress Notes (Signed)
UR chart review completed.  

## 2015-09-08 NOTE — Lactation Note (Signed)
This note was copied from the chart of Abigail Rubio. Lactation Consultation Note  Patient Name: Abigail Rubio [id number] Date: 09/08/2015 Reason for consult: Initial assessment   Initial consult with experienced BF mom of 5. Infant now 72 hours old with 7 BF for 15-35 minutes. 2 attempts for 5 minutes, 2 stools and 0 voids since birth. Infant GA [redacted] weeks and weighs 7 lb 10.8 oz. LATCH scores 7-9 by bedside RN's.  Mom BF other 4 children for 2-3 months each and stopped BF as mother's choice, she denies issues with BF. Enc mom to feed infant 8-12 x in 24 hours at first feeding cues. Mother to keep feeding log. Mom has a ped that she uses for other children, advised infant needs to be seen within 1-2 days of d/c. Mom is a [facility] client and plans to call and make a follow up appointment after d/c. Spanish LC Brochure given, advised of OP Services, BF Support Groups, and phone #. Mom communicated well in English and voiced understanding to all teaching. Nipple care discussed, she denies tenderness at this time. Mom is experiencing uterine cramping and is receiving Ibuprofen. Enc mom to call with questions/concerns or for assistance with feeding.    Maternal Data Formula Feeding for Exclusion: No Does the patient have breastfeeding experience prior to this delivery?: Yes  Feeding Feeding Type: Breast Fed Length of feed: 15 min  LATCH Score/Interventions Latch: Grasps breast easily, tongue down, lips flanged, rhythmical sucking.  Audible Swallowing: A few with stimulation  Type of Nipple: Everted at rest and after stimulation  Comfort (Breast/Nipple): Soft / non-tender     Hold (Positioning): No assistance needed to correctly position infant at breast.  LATCH Score: 9  Lactation Tools Discussed/Used WIC Program: Yes (to call and make appt)   Consult Status Consult Status: Follow-up Date: 09/09/15 Follow-up type: In-patient    Abigail Rubio 09/08/2015, 12:07 PM

## 2015-09-08 NOTE — Progress Notes (Signed)
RN IN ROOM AND PT UP AMBULATING IN ROOM, PT STATED SHE WENT TO BATHROOM AND CLOT FELL ON FLOOR . PT SHOWED RN CLOT. CLOT SIZ OF SMALL GOLF BALL , OLD DARK  35 CC'S EBL PT INFORMED BY RN IF ANY MORE CLOTS PLEASE NOTIFY RN AND SAVE CLOT. PT AGREED.

## 2015-09-08 NOTE — Progress Notes (Signed)
CLINICAL SOCIAL WORK MATERNAL/CHILD NOTE  Patient Details  Name: Abigail Rubio MRN: [id number] Date of Birth: 09/07/2015  Date:  09/08/2015  Clinical Social Worker Initiating Note:  Yazmin Neault, BSW, MSW intern  Date/ Time Initiated:  09/08/15/1130     Child's Name:  Abigail Rubio    Legal Guardian:  Zabria and Alejandro    Need for Interpreter:  Spanish   Date of Referral:  09/07/15     Reason for Referral:  Behavioral Health Issues, including SI    Referral Source:   Central Nursery    Address:  [address] [place], [place] [address]  Phone number:  [phone]   Household Members:  Self, Minor Children, Spouse   Natural Supports (not living in the home):  Children, Immediate Family, Spouse/significant other   Professional Supports: None   Employment: Unemployed   Type of Work:     Education:      Financial Resources:      Other Resources:      Cultural/Religious Considerations Which May Impact Care:  None Reported   Strengths:  Ability to meet basic needs , Home prepared for child    Risk Factors/Current Problems:  Mental Health Concerns: History of PPD    Cognitive State:  Able to Concentrate , Goal Oriented , Linear Thinking , Insightful    Mood/Affect:  Happy , Interested , Calm , Relaxed    CSW Assessment:  MSW intern presented in patients room due to a consult being placed by central nursery because of a history of PPD. MOB requested for the assessment to be conducted in Spanish and provided verbal consent for MSW intern to engage. Per MOB, she had a quick delivery and was transitioning well into postpartum.  MOB shared the birthing process was induced because she was a week over her due date. MOB stated she is breastfeeding the infant and did not voice any concerns. MOB shared she has breastfeed all her children and has never had any issues. MOB did complain about having really bad abdominal pain and requested for MSW intern to let her  RN know she wanted pain meds.   MOB shared she has five other children and two step-daughters that live in the home with her and her husband. MOB stated FOB is the primary breadwinner in the house and expressed feeling well supported by FOB and all their children. MOB shared they have a big home and she loves having a big family. MOB expressed the importance of her family and the love she has in caring for them and making sure they have all they need. MOB stated all the children are excited for the infant to come home and that she has met all of the infant's basic needs. MOB shared both her and FOB have their parents to help them out as well as siblings and other relatives. MOB reported that her three youngest children are currently at her sister-in-law's house until she gets discharged.   MSW intern inquired about MOB's history of PPD. MOB shared she experienced mild and brief PPD after her last three pregnancies. MOB stated she would not get enough sleep because the babies would cry and keep her up at night, she would get overwhelmed and just cry a lot. Per MOB, FOB asked for help around 2012-2013 from her OB [facility]. MOB shared that a person came to her home and conducted an assessment. Per MOB, they determined she was fine and did not prescribe her any medications   or recommend therapy. MOB reported that she has a great support system and that FOB is very involved in regard to her history of PPD. MOB did not voice any concerns during this pregnancy. MSW intern provided education on perinatal mood disorders and the hospital's support group. MOB was appreciative about the information provided but denied having any further questions. MOB stated that if needs arise she would contact her OB again. Per MOB, her PPD has been very mild and she has been able to get through it with the love and support of her family and husband.   MOB was starting to complain more about the pain and no longer was engaging  the same in the assessment. MSW intern provided MOB with a handout on perinatal mood disorders and informed MOB she could contact her if needs arise. MOB was appreciative about the information provided and agreed to contact MSW intern if needs arise.   CSW Plan/Description:  Patient/Family Education- MSW intern provided education on perinatal mood disorders and the hospital's support group.  No Further Intervention Required/No Barriers to Discharge    Jayan Raymundo N, LCSW 09/08/2015, 12:26 PM  

## 2015-09-08 NOTE — Progress Notes (Signed)
Post Partum Day 1  Subjective:  Abigail Rubio is a 35 y.o. [id number] [redacted]w[redacted]d s/p PDIOL.  No acute events overnight.  Pt denies problems with ambulating, voiding or po intake.  She denies nausea or vomiting.  Pain is well controlled.  She has had flatus. She has had bowel movement.  Lochia Minimal.  Plan for birth control is Depo-Provera.  Method of Feeding: Breast.   Objective: BP 102/63 mmHg  Pulse 58  Temp(Src) 98.5 F (36.9 C) (Axillary)  Resp 18  LMP 11/14/2014  Breastfeeding? Unknown  Physical Exam:  General: alert, cooperative and no distress Lochia:normal flow Chest: CTAB Heart: RRR no m/r/g Abdomen: +BS, soft, nontender, fundus firm at/below umbilicus Uterine Fundus: firm, below umbilicus  DVT Evaluation: No evidence of DVT seen on physical exam. Extremities: no edema   Recent Labs  09/07/15 1200  HGB 11.1*  HCT 33.7*    Assessment/Plan:  ASSESSMENT: Abigail Rubio is a 35 y.o. [id number] [redacted]w[redacted]d ppd #1 s/p NSVD doing well.   Plan for discharge tomorrow   LOS: 1 day   Seven Dollens 09/08/2015, 10:32 AM

## 2015-09-09 MED ORDER — IBUPROFEN 600 MG PO TABS: 600.0000 mg | ORAL_TABLET | Freq: Four times a day (QID) | ORAL | Status: DC | PRN

## 2015-09-09 MED ORDER — DOCUSATE SODIUM 100 MG PO CAPS: 100.0000 mg | ORAL_CAPSULE | Freq: Two times a day (BID) | ORAL | Status: AC

## 2015-09-09 MED ORDER — DOCUSATE SODIUM 100 MG PO CAPS: 100.0000 mg | ORAL_CAPSULE | Freq: Every day | ORAL | Status: DC

## 2015-09-09 MED ORDER — ACETAMINOPHEN 325 MG PO TABS: 650.0000 mg | ORAL_TABLET | ORAL | Status: AC | PRN

## 2015-09-09 NOTE — Discharge Summary (Signed)
OB Discharge Summary     Patient Name: Abigail Rubio DOB: 04/04/1981 MRN: [id number]  Date of admission: 09/07/2015 Delivering MD: Lyndel Safe NILES   Date of discharge: 09/09/2015  Admitting diagnosis: INDUCTION Intrauterine pregnancy: [redacted]w[redacted]d     Secondary diagnosis:  Principal Problem:   NSVD (normal spontaneous vaginal delivery) Active Problems:   Previous cesarean delivery, antepartum condition or complication   Pregnancy   Post term pregnancy, 41 weeks  Additional problems: none     Discharge diagnosis: Term Pregnancy Delivered                                                                                                Post partum procedures:none  Augmentation: AROM and Pitocin  Complications: None  Hospital course:  Induction of Labor With Vaginal Delivery   35 y.o. yo [id number] at [redacted]w[redacted]d was admitted to the hospital 09/07/2015 for induction of labor.  Indication for induction: Postdates.  Patient had an uncomplicated labor course as follows: Membrane Rupture Time/Date: 5:30 PM ,09/07/2015   Intrapartum Procedures: Episiotomy: None [1]                                         Lacerations:  None [1]  Patient had delivery of a Viable infant.  Information for the patient's newborn:  Abigail, Rubio [[id number]]  Delivery Method: Vag-Spont  This was a successful VBAC.   09/07/2015  Details of delivery can be found in separate delivery note.  Patient had a routine postpartum course. Patient is discharged home 09/09/2015.   Physical exam  Filed Vitals:   09/08/15 0159 09/08/15 1202 09/08/15 1750 09/09/15 0704  BP: 102/63 115/69 92/50 97/47   Pulse: 58 62 63 62  Temp: 98.5 F (36.9 C) 97.7 F (36.5 C) 98.3 F (36.8 C) 98.2 F (36.8 C)  TempSrc: Axillary Oral Oral Oral  Resp: SpO2:    100%   General: alert, cooperative and no distress Uterine Fundus: firm Incision: N/A DVT Evaluation: No evidence of DVT seen on physical exam. No cords  or calf tenderness. No significant calf/ankle edema. Labs: Lab Results  Component Value Date   WBC 8.9 09/07/2015   HGB 11.1* 09/07/2015   HCT 33.7* 09/07/2015   MCV 86.2 09/07/2015   PLT 293 09/07/2015   CMP Latest Ref Rng 10/14/2014  Glucose 70 - 99 mg/dL 97  BUN 6 - 23 mg/dL 15  Creatinine 9.62 - 9.52 mg/dL 8.41  Sodium 324 - 401 mmol/L 141  Potassium 3.5 - 5.1 mmol/L 4.3  Chloride 96 - 112 mmol/L 105  CO2 19 - 32 mEq/L -  Calcium 8.4 - 10.5 mg/dL -  Total Protein 6.0 - 8.3 g/dL 7.5  Total Bilirubin 0.3 - 1.2 mg/dL 0.2(V)  Alkaline Phos 39 - 117 U/L 54  AST 0 - 37 U/L 24  ALT 0 - 35 U/L 17    Discharge instruction: per After Visit Summary and "Baby and Me Booklet".  After visit meds:  Medication List    STOP taking these medications        cyclobenzaprine 10 MG tablet  Commonly known as:  FLEXERIL      TAKE these medications        acetaminophen 325 MG tablet  Commonly known as:  TYLENOL  Take 2 tablets (650 mg total) by mouth every 4 (four) hours as needed (for pain scale < 4).     docusate sodium 100 MG capsule  Commonly known as:  COLACE  Take 1 capsule (100 mg total) by mouth 2 (two) times daily.     ibuprofen 600 MG tablet  Commonly known as:  ADVIL,MOTRIN  Take 1 tablet (600 mg total) by mouth every 6 (six) hours as needed.     prenatal multivitamin Tabs tablet  Take 1 tablet by mouth daily at 12 noon.        Diet: routine diet  Activity: Advance as tolerated. Pelvic rest for 6 weeks.   Outpatient follow up:6 weeks Follow up Appt:No future appointments. Follow up Visit:No Follow-up on file.  Postpartum contraception: Depo Provera  Newborn Data: Live born female  Birth Weight: 7 lb 10.8 oz (3480 g) APGAR: 8, 9  Baby Feeding: Breast Disposition:home with mother   09/09/2015 Palma Holter, MD  OB FELLOW DISCHARGE ATTESTATION  I have seen and examined this patient and agree with above documentation in the resident's note.    Silvano Bilis, MD 9:53 AM

## 2015-09-09 NOTE — Discharge Instructions (Signed)

## 2017-05-19 ENCOUNTER — Encounter (HOSPITAL_COMMUNITY): Payer: Self-pay | Admitting: Emergency Medicine

## 2017-05-19 ENCOUNTER — Emergency Department (HOSPITAL_COMMUNITY)
Admission: EM | Admit: 2017-05-19 | Discharge: 2017-05-19 | Disposition: A | Discharge status: Home / Self Care | Payer: Self-pay | Attending: Emergency Medicine | Admitting: Emergency Medicine

## 2017-05-19 DIAGNOSIS — R109 Unspecified abdominal pain: Secondary | ICD-10-CM | POA: Insufficient documentation

## 2017-05-19 DIAGNOSIS — Z5321 Procedure and treatment not carried out due to patient leaving prior to being seen by health care provider: Secondary | ICD-10-CM | POA: Insufficient documentation

## 2017-05-19 LAB — COMPREHENSIVE METABOLIC PANEL
ALT: 12 U/L — ABNORMAL LOW (ref 14–54)
AST: 18 U/L (ref 15–41)
Albumin: 3.9 g/dL (ref 3.5–5.0)
Alkaline Phosphatase: 55 U/L (ref 38–126)
Anion gap: 12 (ref 5–15)
BILIRUBIN TOTAL: 0.2 mg/dL — AB (ref 0.3–1.2)
BUN: 7 mg/dL (ref 6–20)
CO2: 19 mmol/L — ABNORMAL LOW (ref 22–32)
Calcium: 8.9 mg/dL (ref 8.9–10.3)
Chloride: 105 mmol/L (ref 101–111)
Creatinine, Ser: 0.86 mg/dL (ref 0.44–1.00)
Glucose, Bld: 97 mg/dL (ref 65–99)
POTASSIUM: 3.1 mmol/L — AB (ref 3.5–5.1)
Sodium: 136 mmol/L (ref 135–145)
TOTAL PROTEIN: 7 g/dL (ref 6.5–8.1)

## 2017-05-19 LAB — URINALYSIS, ROUTINE W REFLEX MICROSCOPIC
BACTERIA UA: NONE SEEN
BILIRUBIN URINE: NEGATIVE
Glucose, UA: NEGATIVE mg/dL
KETONES UR: NEGATIVE mg/dL
NITRITE: NEGATIVE
Protein, ur: NEGATIVE mg/dL
Specific Gravity, Urine: 1.003 — ABNORMAL LOW (ref 1.005–1.030)
pH: 7 (ref 5.0–8.0)

## 2017-05-19 LAB — CBC
HEMATOCRIT: 32.4 % — AB (ref 36.0–46.0)
Hemoglobin: 10.7 g/dL — ABNORMAL LOW (ref 12.0–15.0)
MCH: 27.5 pg (ref 26.0–34.0)
MCHC: 33 g/dL (ref 30.0–36.0)
MCV: 83.3 fL (ref 78.0–100.0)
Platelets: 361 10*3/uL (ref 150–400)
RBC: 3.89 MIL/uL (ref 3.87–5.11)
RDW: 15.4 % (ref 11.5–15.5)
WBC: 8.4 10*3/uL (ref 4.0–10.5)

## 2017-05-19 LAB — I-STAT BETA HCG BLOOD, ED (MC, WL, AP ONLY)

## 2017-05-19 LAB — LIPASE, BLOOD: Lipase: 35 U/L (ref 11–51)

## 2017-05-19 NOTE — ED Triage Notes (Signed)
Patient reports hypogastric pain with nausea onset this evening , denies emesis or diarrhea , no fever or chills , pt. added mild spotting at tissue paper when she wiped this evening .

## 2017-05-19 NOTE — ED Notes (Signed)
Per registration pt has left. Need to move them off the floor.

## 2018-05-08 ENCOUNTER — Encounter (HOSPITAL_COMMUNITY): Payer: Self-pay | Admitting: Emergency Medicine

## 2018-05-08 ENCOUNTER — Emergency Department (HOSPITAL_COMMUNITY)
Admission: EM | Admit: 2018-05-08 | Discharge: 2018-05-09 | Disposition: A | Discharge status: Home / Self Care | Payer: Self-pay | Attending: Emergency Medicine | Admitting: Emergency Medicine

## 2018-05-08 ENCOUNTER — Other Ambulatory Visit: Payer: Self-pay

## 2018-05-08 DIAGNOSIS — N12 Tubulo-interstitial nephritis, not specified as acute or chronic: Secondary | ICD-10-CM | POA: Insufficient documentation

## 2018-05-08 DIAGNOSIS — J45909 Unspecified asthma, uncomplicated: Secondary | ICD-10-CM | POA: Insufficient documentation

## 2018-05-08 DIAGNOSIS — Z7722 Contact with and (suspected) exposure to environmental tobacco smoke (acute) (chronic): Secondary | ICD-10-CM | POA: Insufficient documentation

## 2018-05-08 DIAGNOSIS — R110 Nausea: Secondary | ICD-10-CM | POA: Insufficient documentation

## 2018-05-08 LAB — URINALYSIS, ROUTINE W REFLEX MICROSCOPIC
BILIRUBIN URINE: NEGATIVE
Glucose, UA: NEGATIVE mg/dL
KETONES UR: NEGATIVE mg/dL
Nitrite: NEGATIVE
PH: 6 (ref 5.0–8.0)
Protein, ur: 30 mg/dL — AB
Specific Gravity, Urine: 1.008 (ref 1.005–1.030)
WBC, UA: >50 WBC/hpf — ABNORMAL HIGH (ref 0–5)

## 2018-05-08 LAB — I-STAT BETA HCG BLOOD, ED (MC, WL, AP ONLY)

## 2018-05-08 NOTE — ED Triage Notes (Signed)
Patient started with left flank pain that has gotten worse during the day.  Patient took APAP with no relief of the pain.  She states that the pain has now moved into her back and her abdomen is now "swollen".  Patient is nauseated, no vomiting.

## 2018-05-09 ENCOUNTER — Emergency Department (HOSPITAL_COMMUNITY): Payer: Self-pay

## 2018-05-09 LAB — CBC
HCT: 33.8 % — ABNORMAL LOW (ref 36.0–46.0)
Hemoglobin: 10.5 g/dL — ABNORMAL LOW (ref 12.0–15.0)
MCH: 28.8 pg (ref 26.0–34.0)
MCHC: 31.1 g/dL (ref 30.0–36.0)
MCV: 92.6 fL (ref 78.0–100.0)
PLATELETS: 319 10*3/uL (ref 150–400)
RBC: 3.65 MIL/uL — ABNORMAL LOW (ref 3.87–5.11)
RDW: 13.5 % (ref 11.5–15.5)
WBC: 11.8 10*3/uL — AB (ref 4.0–10.5)

## 2018-05-09 LAB — LIPASE, BLOOD: Lipase: 26 U/L (ref 11–51)

## 2018-05-09 LAB — BASIC METABOLIC PANEL
Anion gap: 9 (ref 5–15)
BUN: 6 mg/dL (ref 6–20)
CO2: 25 mmol/L (ref 22–32)
CREATININE: 0.72 mg/dL (ref 0.44–1.00)
Calcium: 9.1 mg/dL (ref 8.9–10.3)
Chloride: 104 mmol/L (ref 98–111)
GFR calc Af Amer: >60 mL/min (ref >60)
Glucose, Bld: 111 mg/dL — ABNORMAL HIGH (ref 70–99)
Potassium: 3.3 mmol/L — ABNORMAL LOW (ref 3.5–5.1)
Sodium: 138 mmol/L (ref 135–145)

## 2018-05-09 MED ORDER — SODIUM CHLORIDE 0.9 % IV BOLUS
1000.0000 mL | Freq: Once | INTRAVENOUS | Status: AC
  Administered 2018-05-09: 1000 mL via INTRAVENOUS

## 2018-05-09 MED ORDER — ONDANSETRON HCL 4 MG/2ML IJ SOLN
INTRAMUSCULAR | Status: AC
  Filled 2018-05-09: qty 2

## 2018-05-09 MED ORDER — ONDANSETRON 4 MG PO TBDP: 4.0000 mg | ORAL_TABLET | Freq: Three times a day (TID) | ORAL | 0 refills | Status: AC | PRN

## 2018-05-09 MED ORDER — ONDANSETRON HCL 4 MG/2ML IJ SOLN
4.0000 mg | Freq: Once | INTRAMUSCULAR | Status: AC
  Administered 2018-05-09: 4 mg via INTRAVENOUS

## 2018-05-09 MED ORDER — SODIUM CHLORIDE 0.9 % IV SOLN
INTRAVENOUS | Status: AC
  Filled 2018-05-09: qty 20

## 2018-05-09 MED ORDER — SODIUM CHLORIDE 0.9 % IV SOLN
2.0000 g | Freq: Once | INTRAVENOUS | Status: AC
  Administered 2018-05-09: 2 g via INTRAVENOUS

## 2018-05-09 MED ORDER — KETOROLAC TROMETHAMINE 30 MG/ML IJ SOLN
INTRAMUSCULAR | Status: AC
  Filled 2018-05-09: qty 1

## 2018-05-09 MED ORDER — CEPHALEXIN 500 MG PO CAPS: 500.0000 mg | ORAL_CAPSULE | Freq: Three times a day (TID) | ORAL | 0 refills | Status: DC

## 2018-05-09 MED ORDER — KETOROLAC TROMETHAMINE 30 MG/ML IJ SOLN
30.0000 mg | Freq: Once | INTRAMUSCULAR | Status: AC
  Administered 2018-05-09: 30 mg via INTRAVENOUS

## 2018-05-09 MED ORDER — HYDROCODONE-ACETAMINOPHEN 5-325 MG PO TABS: 1.0000 | ORAL_TABLET | ORAL | 0 refills | Status: AC | PRN

## 2018-05-09 NOTE — ED Provider Notes (Signed)
[facility] EMERGENCY DEPARTMENT Provider Note   CSN: [id number] Arrival date & time: 05/08/18  2218     History   Chief Complaint Chief Complaint  Patient presents with  . Flank Pain  . Back Pain    HPI Abigail Rubio is a 37 y.o. female.  The history is provided by the patient and medical records.  Flank Pain   Back Pain       37 year old female with history of anemia, asthma, depression, GERD, history of hepatitis, presenting to the ED with left flank and back pain.  States initially this started on Sunday, mostly localized to her left back.  States since then it has started migrating around her left side and is become more severe.  She reports nausea but denies vomiting.  Does report pressure sensation when urinating but denies dysuria or hematuria.  She has not had any pelvic pain or new vaginal discharge.  Does report some chills but denies fever.  No history of kidney stones.  Reports history of UTI many years ago.  She has been taking Tylenol at home without relief.  Past Medical History:  Diagnosis Date  . Anemia   . Asthma   . Asthma 1980  . Depression    PP  . GERD (gastroesophageal reflux disease)   . History of positive PPD 2007   negative chest X ray completed tx 2007  . Hx of hepatitis   . Language barrier   . Late prenatal care   . Obesity   . Trauma to ear    left  . Vaginal Pap smear, abnormal     Patient Active Problem List   Diagnosis Date Noted  . Pregnancy 09/07/2015  . Post term pregnancy, 41 weeks 09/07/2015  . NSVD (normal spontaneous vaginal delivery) 09/07/2015  . Abdominal pain in pregnancy   . Previous cesarean delivery, antepartum condition or complication 12/21/2012    Past Surgical History:  Procedure Laterality Date  . apendectomy  2003  . APPENDECTOMY    . CESAREAN SECTION    . CESAREAN SECTION  2004  . COLPOSCOPY  2004  . GYNECOLOGIC CRYOSURGERY       OB History    Gravida  6   Para  5   Term  5   Preterm      AB  1   Living  5     SAB  1   TAB      Ectopic      Multiple  0   Live Births  5            Home Medications    Prior to Admission medications   Medication Sig Start Date End Date Taking? Authorizing Provider  acetaminophen (TYLENOL) 325 MG tablet Take 2 tablets (650 mg total) by mouth every 4 (four) hours as needed (for pain scale < 4). 09/09/15   Palma Holter, MD  docusate sodium (COLACE) 100 MG capsule Take 1 capsule (100 mg total) by mouth 2 (two) times daily. 09/09/15   Palma Holter, MD  ibuprofen (ADVIL,MOTRIN) 600 MG tablet Take 1 tablet (600 mg total) by mouth every 6 (six) hours as needed. 09/09/15   Palma Holter, MD  Prenatal Vit-Fe Fumarate-FA (PRENATAL MULTIVITAMIN) TABS tablet Take 1 tablet by mouth daily at 12 noon.    [provider]    Family History Family History  Problem Relation Age of Onset  . Kidney disease Sister   . Urolithiasis Sister   .  Cancer Maternal Aunt        breast  . Heart disease Maternal Aunt   . Birth defects Maternal Uncle        hole in heart  . Hypertension Maternal Grandmother   . Anemia Maternal Grandmother   . Hyperlipidemia Maternal Grandfather   . Diabetes Maternal Grandfather   . Anemia Mother     Social History Social History   Tobacco Use  . Smoking status: Passive Smoke Exposure - Never Smoker  . Smokeless tobacco: Never Used  Substance Use Topics  . Alcohol use: No  . Drug use: No     Allergies   Pork-derived products   Review of Systems Review of Systems  Genitourinary: Positive for flank pain.  Musculoskeletal: Positive for back pain.  All other systems reviewed and are negative.    Physical Exam Updated Vital Signs BP (!) 142/95   Pulse 85   Temp 98.6 F (37 C) (Oral)   Resp 17   SpO2 100%   Physical Exam  Constitutional: She is oriented to person, place, and time. She appears well-developed and well-nourished.  HENT:  Head:  Normocephalic and atraumatic.  Mouth/Throat: Oropharynx is clear and moist.  Eyes: Pupils are equal, round, and reactive to light. Conjunctivae and EOM are normal.  Neck: Normal range of motion.  Cardiovascular: Normal rate, regular rhythm and normal heart sounds.  Pulmonary/Chest: Effort normal and breath sounds normal. No stridor. No respiratory distress.  Abdominal: Soft. Bowel sounds are normal. There is CVA tenderness (left).  Musculoskeletal: Normal range of motion.  Neurological: She is alert and oriented to person, place, and time.  Skin: Skin is warm and dry.  Psychiatric: She has a normal mood and affect.  Nursing note and vitals reviewed.    ED Treatments / Results  Labs (all labs ordered are listed, but only abnormal results are displayed) Labs Reviewed  URINALYSIS, ROUTINE W REFLEX MICROSCOPIC - Abnormal; Notable for the following components:      Result Value   APPearance HAZY (*)    Hgb urine dipstick MODERATE (*)    Protein, ur 30 (*)    Leukocytes, UA LARGE (*)    WBC, UA >50 (*)    Bacteria, UA MANY (*)    All other components within normal limits  BASIC METABOLIC PANEL - Abnormal; Notable for the following components:   Potassium 3.3 (*)    Glucose, Bld 111 (*)    All other components within normal limits  CBC - Abnormal; Notable for the following components:   WBC 11.8 (*)    RBC 3.65 (*)    Hemoglobin 10.5 (*)    HCT 33.8 (*)    All other components within normal limits  URINE CULTURE  LIPASE, BLOOD  I-STAT BETA HCG BLOOD, ED ([facility], [facility], [facility] ONLY)    EKG None  Radiology No results found.  Procedures Procedures (including critical care time)  Medications Ordered in ED Medications  sodium chloride 0.9 % bolus 1,000 mL (0 mLs Intravenous Stopped 05/09/18 0224)  ondansetron (ZOFRAN) injection 4 mg (4 mg Intravenous Given 05/09/18 0130)  ketorolac (TORADOL) 30 MG/ML injection 30 mg (30 mg Intravenous Given 05/09/18 0130)  cefTRIAXone (ROCEPHIN) 2 g in  sodium chloride 0.9 % 100 mL IVPB (0 g Intravenous Stopped 05/09/18 0205)     Initial Impression / Assessment and Plan / ED Course  I have reviewed the triage vital signs and the nursing notes.  Pertinent labs & imaging results that were available  during my care of the patient were reviewed by me and considered in my medical decision making (see chart for details).  37 year old female here with left-sided back and flank pain.  Began on Sunday.  Reports subjective fever and chills as well as nausea but denies vomiting.  She is afebrile and nontoxic in appearance but does appear uncomfortable and nauseated.  Labs obtained from triage, leukocytosis noted.  UA also appears infectious.  Denies history of any recent UTI, recalls one several years ago.  No history of kidney stones.  Will give IV fluids, Zofran, Toradol, and IV Rocephin.  Plan for CT renal study to exclude stone given her unilateral flank pain.  2:22 AM CT with findings of pyelonephritis.  Results discussed with patient, she acknowleged understanding.  Reports feeling a lot better after IVF and medications here, overall appearance improved.  She remains afebrile, non-toxic.  I feel she is appropriate for OP management.  Discharge home with Keflex, Vicodin, and Zofran.  Encourage good oral hydration.  Can follow-up with PCP.  She will return here for any new or worsening symptoms.  Final Clinical Impressions(s) / ED Diagnoses   Final diagnoses:  Pyelonephritis  Nausea    ED Discharge Orders         Ordered    ondansetron (ZOFRAN ODT) 4 MG disintegrating tablet  Every 8 hours PRN     05/09/18 0224    cephALEXin (KEFLEX) 500 MG capsule  3 times daily     05/09/18 0224    HYDROcodone-acetaminophen (NORCO/VICODIN) 5-325 MG tablet  Every 4 hours PRN     09 /18/19 0225           Garlon HatchetSanders, Catherene Kaleta M, PA-C 05/09/18 [id number]    Zadie RhineWickline, Donald, MD 05/09/18 [phone]

## 2018-05-09 NOTE — Discharge Instructions (Signed)
Take the prescribed medication as directed.  Make sure to drink plenty of water. Follow-up with your doctor-- would like them to re-check your urine after you finish medications to ensure infection has cleared. Return to the ED for new or worsening symptoms.

## 2018-05-09 NOTE — ED Notes (Signed)
Patient transported to CT 

## 2019-05-09 ENCOUNTER — Emergency Department (HOSPITAL_COMMUNITY)
Admission: EM | Admit: 2019-05-09 | Discharge: 2019-05-10 | Disposition: A | Discharge status: Home / Self Care | Payer: Self-pay | Attending: Emergency Medicine | Admitting: Emergency Medicine

## 2019-05-09 ENCOUNTER — Encounter (HOSPITAL_COMMUNITY): Payer: Self-pay | Admitting: Emergency Medicine

## 2019-05-09 ENCOUNTER — Other Ambulatory Visit: Payer: Self-pay

## 2019-05-09 DIAGNOSIS — Y939 Activity, unspecified: Secondary | ICD-10-CM | POA: Insufficient documentation

## 2019-05-09 DIAGNOSIS — Y999 Unspecified external cause status: Secondary | ICD-10-CM | POA: Insufficient documentation

## 2019-05-09 DIAGNOSIS — R0789 Other chest pain: Secondary | ICD-10-CM | POA: Insufficient documentation

## 2019-05-09 DIAGNOSIS — R10814 Left lower quadrant abdominal tenderness: Secondary | ICD-10-CM | POA: Insufficient documentation

## 2019-05-09 DIAGNOSIS — R1011 Right upper quadrant pain: Secondary | ICD-10-CM | POA: Insufficient documentation

## 2019-05-09 DIAGNOSIS — W19XXXA Unspecified fall, initial encounter: Secondary | ICD-10-CM

## 2019-05-09 DIAGNOSIS — W109XXA Fall (on) (from) unspecified stairs and steps, initial encounter: Secondary | ICD-10-CM | POA: Insufficient documentation

## 2019-05-09 DIAGNOSIS — R10813 Right lower quadrant abdominal tenderness: Secondary | ICD-10-CM | POA: Insufficient documentation

## 2019-05-09 DIAGNOSIS — S301XXA Contusion of abdominal wall, initial encounter: Secondary | ICD-10-CM | POA: Insufficient documentation

## 2019-05-09 DIAGNOSIS — Z789 Other specified health status: Secondary | ICD-10-CM

## 2019-05-09 DIAGNOSIS — S32018A Other fracture of first lumbar vertebra, initial encounter for closed fracture: Secondary | ICD-10-CM | POA: Insufficient documentation

## 2019-05-09 DIAGNOSIS — M79674 Pain in right toe(s): Secondary | ICD-10-CM | POA: Insufficient documentation

## 2019-05-09 DIAGNOSIS — Y929 Unspecified place or not applicable: Secondary | ICD-10-CM | POA: Insufficient documentation

## 2019-05-09 DIAGNOSIS — S32009A Unspecified fracture of unspecified lumbar vertebra, initial encounter for closed fracture: Secondary | ICD-10-CM

## 2019-05-09 DIAGNOSIS — M546 Pain in thoracic spine: Secondary | ICD-10-CM | POA: Insufficient documentation

## 2019-05-09 NOTE — ED Triage Notes (Signed)
Pt reports falling down 3 steps due to the rain earlier this afternoon, c/o R lower back pain, has taken ibuprofen without relief.

## 2019-05-10 ENCOUNTER — Emergency Department (HOSPITAL_COMMUNITY): Payer: Self-pay

## 2019-05-10 LAB — I-STAT CHEM 8, ED
BUN: 7 mg/dL (ref 6–20)
Calcium, Ion: 1.18 mmol/L (ref 1.15–1.40)
Chloride: 104 mmol/L (ref 98–111)
Creatinine, Ser: 0.6 mg/dL (ref 0.44–1.00)
Glucose, Bld: 102 mg/dL — ABNORMAL HIGH (ref 70–99)
HCT: 37 % (ref 36.0–46.0)
Hemoglobin: 12.6 g/dL (ref 12.0–15.0)
Potassium: 3.6 mmol/L (ref 3.5–5.1)
Sodium: 140 mmol/L (ref 135–145)
TCO2: 24 mmol/L (ref 22–32)

## 2019-05-10 LAB — PREGNANCY, URINE: Preg Test, Ur: NEGATIVE

## 2019-05-10 MED ORDER — KETOROLAC TROMETHAMINE 30 MG/ML IJ SOLN
30.0000 mg | Freq: Once | INTRAMUSCULAR | Status: AC
  Administered 2019-05-10: 05:00:00 30 mg via INTRAVENOUS
  Filled 2019-05-10: qty 1

## 2019-05-10 MED ORDER — FENTANYL CITRATE (PF) 100 MCG/2ML IJ SOLN: 75.0000 ug | Freq: Once | INTRAMUSCULAR | Status: DC

## 2019-05-10 MED ORDER — METHOCARBAMOL 500 MG PO TABS: 500.0000 mg | ORAL_TABLET | Freq: Two times a day (BID) | ORAL | 0 refills | Status: DC

## 2019-05-10 MED ORDER — METHOCARBAMOL 500 MG PO TABS
500.0000 mg | ORAL_TABLET | Freq: Once | ORAL | Status: AC
  Administered 2019-05-10: 05:00:00 500 mg via ORAL
  Filled 2019-05-10: qty 1

## 2019-05-10 MED ORDER — OXYCODONE-ACETAMINOPHEN 5-325 MG PO TABS: 1.0000 | ORAL_TABLET | Freq: Three times a day (TID) | ORAL | 0 refills | Status: AC | PRN

## 2019-05-10 MED ORDER — IOHEXOL 300 MG/ML  SOLN
100.0000 mL | Freq: Once | INTRAMUSCULAR | Status: AC | PRN
  Administered 2019-05-10: 100 mL via INTRAVENOUS

## 2019-05-10 MED ORDER — PREDNISONE 10 MG (21) PO TBPK: ORAL_TABLET | Freq: Every day | ORAL | 0 refills | Status: DC

## 2019-05-10 MED ORDER — HYDROCODONE-ACETAMINOPHEN 5-325 MG PO TABS
1.0000 | ORAL_TABLET | Freq: Once | ORAL | Status: AC
  Administered 2019-05-10: 1 via ORAL
  Filled 2019-05-10: qty 1

## 2019-05-10 MED ORDER — HYDROMORPHONE HCL 1 MG/ML IJ SOLN
0.5000 mg | Freq: Once | INTRAMUSCULAR | Status: AC
  Administered 2019-05-10: 04:00:00 0.5 mg via INTRAVENOUS
  Filled 2019-05-10: qty 1

## 2019-05-10 NOTE — ED Notes (Signed)
ED Provider at bedside. 

## 2019-05-10 NOTE — ED Notes (Signed)
One family visitor at bedside.

## 2019-05-10 NOTE — ED Provider Notes (Signed)
[facility] Provider Note   CSN: [id number] Arrival date & time: 05/09/19  2225     History   Chief Complaint Chief Complaint  Patient presents with   Fall   Back Pain    HPI Abigail Rubio is a 38 y.o. female with history of asthma and GERD who presents to the emergency department with a chief complaint of fall.  The patient reports that she was holding her son and her left arm when she slipped due to the rain and fell down 3 steps onto gravel.  She states that she was trying to protect her son and fell entirely onto her right side.  She reports that she was able to get up from the fall and has been ambulatory, but is having severe pain.  She took ibuprofen for her symptoms around noon with no improvement.  She endorses right mid back pain that radiates around her right flank into her right upper abdomen.  Although the back pain begins in her right mid back, it extends all the way down to the right hip on her flank.  She reports that she is now started to have some numbness to the right mid back.  She also endorses right upper and lower abdominal pain.  She complains of right great toe pain and suspects that her toe got caught under her shoe in the fall.  She denies hitting her head, syncope, nausea, or vomiting, neck pain or stiffness, shortness of breath, chest pain, weakness, hematuria, dysuria, vaginal bleeding,     The history is provided by the patient. No language interpreter was used.    Past Medical History:  Diagnosis Date   Anemia    Asthma    Asthma 1980   Depression    PP   GERD (gastroesophageal reflux disease)    History of positive PPD 2007   negative chest X ray completed tx 2007   Hx of hepatitis    Language barrier    Late prenatal care    Obesity    Trauma to ear    left   Vaginal Pap smear, abnormal     Patient Active Problem List   Diagnosis Date Noted   Pregnancy 09/07/2015   Post  term pregnancy, 41 weeks 09/07/2015   NSVD (normal spontaneous vaginal delivery) 09/07/2015   Abdominal pain in pregnancy    Previous cesarean delivery, antepartum condition or complication 12/21/2012    Past Surgical History:  Procedure Laterality Date   apendectomy  2003   APPENDECTOMY     CESAREAN SECTION     CESAREAN SECTION  2004   COLPOSCOPY  2004   GYNECOLOGIC CRYOSURGERY       OB History    Gravida  6   Para  5   Term  5   Preterm      AB  1   Living  5     SAB  1   TAB      Ectopic      Multiple  0   Live Births  5            Home Medications    Prior to Admission medications   Medication Sig Start Date End Date Taking? Authorizing Provider  acetaminophen (TYLENOL) 325 MG tablet Take 2 tablets (650 mg total) by mouth every 4 (four) hours as needed (for pain scale < 4). 09/09/15   Palma HolterGunadasa, Kanishka G, MD  cephALEXin (KEFLEX) 500 MG capsule Take 1  capsule (500 mg total) by mouth 3 (three) times daily. 05/09/18   Garlon Hatchet, PA-C  docusate sodium (COLACE) 100 MG capsule Take 1 capsule (100 mg total) by mouth 2 (two) times daily. 09/09/15   Palma Holter, MD  HYDROcodone-acetaminophen (NORCO/VICODIN) 5-325 MG tablet Take 1 tablet by mouth every 4 (four) hours as needed. 05/09/18   Garlon Hatchet, PA-C  ibuprofen (ADVIL,MOTRIN) 600 MG tablet Take 1 tablet (600 mg total) by mouth every 6 (six) hours as needed. 09/09/15   Palma Holter, MD  methocarbamol (ROBAXIN) 500 MG tablet Take 1 tablet (500 mg total) by mouth 2 (two) times daily. 05/10/19   Bryannah Boston A, PA-C  ondansetron (ZOFRAN ODT) 4 MG disintegrating tablet Take 1 tablet (4 mg total) by mouth every 8 (eight) hours as needed for nausea. 05/09/18   Garlon Hatchet, PA-C  oxyCODONE-acetaminophen (PERCOCET/ROXICET) 5-325 MG tablet Take 1 tablet by mouth every 8 (eight) hours as needed for severe pain. 05/10/19   Treyce Spillers A, PA-C  predniSONE (STERAPRED UNI-PAK 21 TAB) 10  MG (21) TBPK tablet Take by mouth daily. Take 6 tabs by mouth daily  for 2 days, then 5 tabs for 2 days, then 4 tabs for 2 days, then 3 tabs for 2 days, 2 tabs for 2 days, then 1 tab by mouth daily for 2 days 05/10/19   Mayela Bullard A, PA-C  Prenatal Vit-Fe Fumarate-FA (PRENATAL MULTIVITAMIN) TABS tablet Take 1 tablet by mouth daily at 12 noon.    [provider]    Family History Family History  Problem Relation Age of Onset   Kidney disease Sister    Urolithiasis Sister    Cancer Maternal Aunt        breast   Heart disease Maternal Aunt    Birth defects Maternal Uncle        hole in heart   Hypertension Maternal Grandmother    Anemia Maternal Grandmother    Hyperlipidemia Maternal Grandfather    Diabetes Maternal Grandfather    Anemia Mother     Social History Social History   Tobacco Use   Smoking status: Passive Smoke Exposure - Never Smoker   Smokeless tobacco: Never Used  Substance Use Topics   Alcohol use: No   Drug use: No     Allergies   Pork-derived products   Review of Systems Review of Systems  Constitutional: Negative for activity change and diaphoresis.  Respiratory: Negative for cough and shortness of breath.   Cardiovascular: Negative for chest pain and palpitations.  Gastrointestinal: Positive for abdominal pain. Negative for blood in stool, nausea and vomiting.  Genitourinary: Negative for dysuria, hematuria, pelvic pain and vaginal bleeding.  Musculoskeletal: Positive for arthralgias, back pain, gait problem and myalgias. Negative for neck pain and neck stiffness.  Skin: Negative for rash.  Allergic/Immunologic: Negative for immunocompromised state.  Neurological: Positive for numbness. Negative for dizziness, weakness and headaches.  Psychiatric/Behavioral: Negative for confusion.     Physical Exam Updated Vital Signs BP 131/88    Pulse 71    Temp 98.5 F (36.9 C) (Oral)    Resp 18    SpO2 98%   Physical Exam Vitals  signs and nursing note reviewed.  Constitutional:      General: She is not in acute distress.    Comments: Appears uncomfortable. Tearful.  HENT:     Head: Normocephalic.  Eyes:     Conjunctiva/sclera: Conjunctivae normal.  Neck:     Musculoskeletal: Neck  supple.  Cardiovascular:     Rate and Rhythm: Normal rate and regular rhythm.     Heart sounds: No murmur. No friction rub. No gallop.      Comments: Radial, DP, and PT pulses are 2+ and symmetric. Pulmonary:     Effort: Pulmonary effort is normal. No respiratory distress.     Breath sounds: No stridor. No wheezing, rhonchi or rales.  Chest:     Chest wall: Tenderness present.  Abdominal:     General: There is no distension.     Palpations: Abdomen is soft. There is no mass.     Tenderness: There is abdominal tenderness. There is no right CVA tenderness, left CVA tenderness, guarding or rebound.     Hernia: No hernia is present.     Comments: Significantly tender to palpation in the right upper quadrant.  Mildly tender to palpation in the right lower quadrant.  There is point tenderness in the left lower quadrant, but left upper quadrant is unremarkable.  Abdomen is soft and nondistended with normoactive bowel sounds.  Musculoskeletal:     Comments: No tenderness to the cervical, thoracic, or lumbar spinous processes or paraspinal muscles.  Tender to palpation to the right inferior, posterior lateral ribs without crepitus or step-offs.  Diffusely tender to palpation to the musculature of the right thoracic and lumbar spine.  Diffusely tender to palpation to the right hip and over the right SI joint.  No left-sided tenderness.  Diffusely tender to palpation to the right great toe.  Sensation is intact.  Good capillary refill.  Increased pain with range of motion.  No tenderness to the remaining digits of the right foot or to the dorsum or plantar surface of the right foot.  Skin:    General: Skin is warm.     Findings: No rash.    Neurological:     Mental Status: She is alert.     Comments: Sensation is intact and equal to the bilateral upper and lower extremities.  5 of 5 strength against resistance with dorsiflexion plantarflexion.  Hobbling, antalgic gait. Patient grabs her right hip with ambulation.  She endorses subjective decrease sensation to the right thoracic back.  Psychiatric:        Behavior: Behavior normal.      ED Treatments / Results  Labs (all labs ordered are listed, but only abnormal results are displayed) Labs Reviewed  I-STAT CHEM 8, ED - Abnormal; Notable for the following components:      Result Value   Glucose, Bld 102 (*)    All other components within normal limits  PREGNANCY, URINE    EKG None  Radiology Ct Chest W Contrast  Result Date: 05/10/2019 CLINICAL DATA:  [place] down steps in the rain, right lower back pain EXAM: CT CHEST, ABDOMEN, AND PELVIS WITH CONTRAST TECHNIQUE: Multidetector CT imaging of the chest, abdomen and pelvis was performed following the standard protocol during bolus administration of intravenous contrast. CONTRAST:  156mL OMNIPAQUE IOHEXOL 300 MG/ML  SOLN COMPARISON:  CT renal colic 67/59/1638 FINDINGS: CT CHEST FINDINGS Cardiovascular: The aortic root is suboptimally assessed given cardiac pulsation artifact. The aorta is normal caliber. No dissection flap or other acute luminal abnormality of the aorta is seen. No periaortic stranding or hemorrhage. Normal central pulmonary arterial caliber. No large central or lobar filling defects are identified. Normal heart size. No pericardial effusion. Mediastinum/Nodes: No mediastinal hematoma. No acute traumatic abnormality of the trachea or esophagus. Thyroid gland and thoracic inlet are unremarkable.  No mediastinal, hilar or axillary adenopathy. Lungs/Pleura: No traumatic abnormality of the lung parenchyma. No consolidation, features of edema, pneumothorax, or effusion. No suspicious pulmonary nodules or masses.  Musculoskeletal: No acute traumatic abnormality of the thorax or chest wall. Included portions of the shoulders are unremarkable. Mild straightening of the normal thoracic kyphosis may in part be positional. No significant spinal canal or foraminal stenosis. CT ABDOMEN PELVIS FINDINGS Hepatobiliary: No hepatic injury or perihepatic hematoma. No other focal liver abnormality is seen. No gallstones, gallbladder wall thickening, or biliary dilatation. Pancreas: Unremarkable. No pancreatic ductal dilatation or surrounding inflammatory changes. Spleen: No splenic injury or perisplenic hematoma. Normal splenic size. No focal splenic lesions. Adrenals/Urinary Tract: No adrenal hemorrhage or concerning adrenal lesions. No renal injury or perirenal hemorrhage or stranding. No extravasation of contrast is seen on excretory phase delayed imaging. Kidneys are otherwise unremarkable, without renal calculi, suspicious lesion, or hydronephrosis. Bladder is decompressed. No convincing evidence of bladder injury. Stomach/Bowel: Distal esophagus, stomach and duodenal sweep are unremarkable. No bowel wall thickening or dilatation. No evidence of obstruction. The appendix is surgically absent. No colonic dilatation or wall thickening. No mesenteric contusion or hematoma. Vascular/Lymphatic: No traumatic vascular abnormality of the abdomen or pelvis. No aortic aneurysm or ectasia. No suspicious or enlarged lymph nodes in the included lymphatic chains. Reproductive: Anteverted uterus with normally positioned radiopaque IUD. Dominant follicle in the left ovary. No concerning adnexal lesions. Other: No free abdominopelvic fluid or air. Small amount of contusion noted posteriorly. Mild body wall edema. Small fat containing umbilical hernia. No traumatic abdominal wall hernia. No body wall hematoma. Musculoskeletal: Minimally displaced fracture of the right transverse process of L1. No other acute osseous injury is noted in the abdomen or  pelvis. Bones of the pelvis remain congruent. Mild sclerosis on the iliac side of both SI joints is compatible with osteitis condensans ilii, a benign incidental finding commonly seen in multiparous females and present on prior CT. IMPRESSION: 1. Minimally displaced fracture of the right transverse process of L1. 2. Mild soft tissue contusion of the posterior abdominal wall. 3. No other acute traumatic injury within the chest, abdomen, or pelvis. These results were called by telephone at the time of interpretation on 05/10/2019 at 3:31 am to provider Tanyika Barros [facility] , who verbally acknowledged these results. Electronically Signed   By: Kreg Shropshire M.D.   On: 05/10/2019 03:31   Ct Abdomen Pelvis W Contrast  Result Date: 05/10/2019 CLINICAL DATA:  [place] down steps in the rain, right lower back pain EXAM: CT CHEST, ABDOMEN, AND PELVIS WITH CONTRAST TECHNIQUE: Multidetector CT imaging of the chest, abdomen and pelvis was performed following the standard protocol during bolus administration of intravenous contrast. CONTRAST:  OMNIPAQUE IOHEXOL 300 MG/ML  SOLN COMPARISON:  CT renal colic 05/09/2018 FINDINGS: CT CHEST FINDINGS Cardiovascular: The aortic root is suboptimally assessed given cardiac pulsation artifact. The aorta is normal caliber. No dissection flap or other acute luminal abnormality of the aorta is seen. No periaortic stranding or hemorrhage. Normal central pulmonary arterial caliber. No large central or lobar filling defects are identified. Normal heart size. No pericardial effusion. Mediastinum/Nodes: No mediastinal hematoma. No acute traumatic abnormality of the trachea or esophagus. Thyroid gland and thoracic inlet are unremarkable. No mediastinal, hilar or axillary adenopathy. Lungs/Pleura: No traumatic abnormality of the lung parenchyma. No consolidation, features of edema, pneumothorax, or effusion. No suspicious pulmonary nodules or masses. Musculoskeletal: No acute traumatic abnormality of the  thorax or chest wall. Included portions of the shoulders  are unremarkable. Mild straightening of the normal thoracic kyphosis may in part be positional. No significant spinal canal or foraminal stenosis. CT ABDOMEN PELVIS FINDINGS Hepatobiliary: No hepatic injury or perihepatic hematoma. No other focal liver abnormality is seen. No gallstones, gallbladder wall thickening, or biliary dilatation. Pancreas: Unremarkable. No pancreatic ductal dilatation or surrounding inflammatory changes. Spleen: No splenic injury or perisplenic hematoma. Normal splenic size. No focal splenic lesions. Adrenals/Urinary Tract: No adrenal hemorrhage or concerning adrenal lesions. No renal injury or perirenal hemorrhage or stranding. No extravasation of contrast is seen on excretory phase delayed imaging. Kidneys are otherwise unremarkable, without renal calculi, suspicious lesion, or hydronephrosis. Bladder is decompressed. No convincing evidence of bladder injury. Stomach/Bowel: Distal esophagus, stomach and duodenal sweep are unremarkable. No bowel wall thickening or dilatation. No evidence of obstruction. The appendix is surgically absent. No colonic dilatation or wall thickening. No mesenteric contusion or hematoma. Vascular/Lymphatic: No traumatic vascular abnormality of the abdomen or pelvis. No aortic aneurysm or ectasia. No suspicious or enlarged lymph nodes in the included lymphatic chains. Reproductive: Anteverted uterus with normally positioned radiopaque IUD. Dominant follicle in the left ovary. No concerning adnexal lesions. Other: No free abdominopelvic fluid or air. Small amount of contusion noted posteriorly. Mild body wall edema. Small fat containing umbilical hernia. No traumatic abdominal wall hernia. No body wall hematoma. Musculoskeletal: Minimally displaced fracture of the right transverse process of L1. No other acute osseous injury is noted in the abdomen or pelvis. Bones of the pelvis remain congruent. Mild  sclerosis on the iliac side of both SI joints is compatible with osteitis condensans ilii, a benign incidental finding commonly seen in multiparous females and present on prior CT. IMPRESSION: 1. Minimally displaced fracture of the right transverse process of L1. 2. Mild soft tissue contusion of the posterior abdominal wall. 3. No other acute traumatic injury within the chest, abdomen, or pelvis. These results were called by telephone at the time of interpretation on 05/10/2019 at 3:31 am to provider Rakin Lemelle [facility] , who verbally acknowledged these results. Electronically Signed   By: Kreg Shropshire M.D.   On: 05/10/2019 03:31   Ct T-spine No Charge  Result Date: 05/10/2019 CLINICAL DATA:  Fall down 3 steps in the rain EXAM: CT THORACIC AND LUMBAR SPINE WITHOUT CONTRAST CT CERVICAL, THORACIC, AND LUMBAR SPINE WITHOUT CONTRAST TECHNIQUE: Multiplanar CT images of the thoracic and lumbar spine were reconstructed from the contemporary CT of the chest, abdomen and pelvis. CONTRAST:  No additional contrast was administered for this examination. COMPARISON:  Contemporary CT Chest, Abdomen and Pelvis, CT renal colic 05/09/2018 FINDINGS: CT THORACIC SPINE FINDINGS Alignment: Mild straightening of the normal midthoracic kyphosis without traumatic listhesis. Vertebrae: Preservation of the vertebral body heights throughout the thoracic spine. No vertebral body fracture or height loss. No suspicious osseous lesions. Paraspinal and other soft tissues: No paravertebral fluid or swelling. No visible canal hematoma. For findings in the chest and upper abdomen, please see dedicated CT report. Disc levels: Intervertebral disc heights are minimally narrowed in the upper thoracic spine with early discogenic changes. Mild facet degenerative changes present at several levels in the spine. No significant spinal canal or foraminal stenosis is seen within the thoracic spine. CT LUMBAR SPINE FINDINGS Segmentation: 5 lumbar type vertebral  bodies are noted. Alignment: Preservation of the normal lumbar lordosis. No traumatic listhesis. Vertebrae: There is a minimally displaced fracture of the right transverse process of L1. No vertebral body fracture or height loss is evident. No other osseous injury or  suspicious osseous lesion is seen. Mild sclerosis on the iliac sides of both SI joints compatible with osteitis condensans ilii. Paraspinal and other soft tissues: No paravertebral fluid or swelling. No visible canal hematoma. Mild posterior soft tissue contusion. Disc levels: No significant spinal canal or foraminal stenosis. IMPRESSION: CT THORACIC SPINE IMPRESSION 1. No acute fracture or traumatic listhesis of the thoracic spine. 2. Minimal intervertebral disc height loss with early endplate changes and mild facet arthrosis. No significant canal stenosis or foraminal narrowing. CT LUMBAR SPINE IMPRESSION 1. Minimally displaced fracture of the right transverse process of L1. 2. No other acute osseous injury or traumatic listhesis of the lumbar spine. 3. No significant canal stenosis or foraminal narrowing. 4. Mild contusion of the posterior midline soft tissues. 5. Osteitis condensans ilii, a benign incidental finding. These results were called by telephone at the time of interpretation on 05/10/2019 at 3:31 am to provider Ronnett Pullin [facility] , who verbally acknowledged these results. Electronically Signed   By: Kreg Shropshire M.D.   On: 05/10/2019 03:40   Ct L-spine No Charge  Result Date: 05/10/2019 CLINICAL DATA:  Fall down 3 steps in the rain EXAM: CT THORACIC AND LUMBAR SPINE WITHOUT CONTRAST CT CERVICAL, THORACIC, AND LUMBAR SPINE WITHOUT CONTRAST TECHNIQUE: Multiplanar CT images of the thoracic and lumbar spine were reconstructed from the contemporary CT of the chest, abdomen and pelvis. CONTRAST:  No additional contrast was administered for this examination. COMPARISON:  Contemporary CT Chest, Abdomen and Pelvis, CT renal colic 05/09/2018 FINDINGS:  CT THORACIC SPINE FINDINGS Alignment: Mild straightening of the normal midthoracic kyphosis without traumatic listhesis. Vertebrae: Preservation of the vertebral body heights throughout the thoracic spine. No vertebral body fracture or height loss. No suspicious osseous lesions. Paraspinal and other soft tissues: No paravertebral fluid or swelling. No visible canal hematoma. For findings in the chest and upper abdomen, please see dedicated CT report. Disc levels: Intervertebral disc heights are minimally narrowed in the upper thoracic spine with early discogenic changes. Mild facet degenerative changes present at several levels in the spine. No significant spinal canal or foraminal stenosis is seen within the thoracic spine. CT LUMBAR SPINE FINDINGS Segmentation: 5 lumbar type vertebral bodies are noted. Alignment: Preservation of the normal lumbar lordosis. No traumatic listhesis. Vertebrae: There is a minimally displaced fracture of the right transverse process of L1. No vertebral body fracture or height loss is evident. No other osseous injury or suspicious osseous lesion is seen. Mild sclerosis on the iliac sides of both SI joints compatible with osteitis condensans ilii. Paraspinal and other soft tissues: No paravertebral fluid or swelling. No visible canal hematoma. Mild posterior soft tissue contusion. Disc levels: No significant spinal canal or foraminal stenosis. IMPRESSION: CT THORACIC SPINE IMPRESSION 1. No acute fracture or traumatic listhesis of the thoracic spine. 2. Minimal intervertebral disc height loss with early endplate changes and mild facet arthrosis. No significant canal stenosis or foraminal narrowing. CT LUMBAR SPINE IMPRESSION 1. Minimally displaced fracture of the right transverse process of L1. 2. No other acute osseous injury or traumatic listhesis of the lumbar spine. 3. No significant canal stenosis or foraminal narrowing. 4. Mild contusion of the posterior midline soft tissues. 5.  Osteitis condensans ilii, a benign incidental finding. These results were called by telephone at the time of interpretation on 05/10/2019 at 3:31 am to provider [place] Deleo [facility] , who verbally acknowledged these results. Electronically Signed   By: Kreg Shropshire M.D.   On: 05/10/2019 03:40   Dg Toe Great Right  Result Date: 05/10/2019 CLINICAL DATA:  Fall down stairs with first toe pain, initial encounter EXAM: RIGHT GREAT TOE COMPARISON:  None. FINDINGS: There is no evidence of fracture or dislocation. There is no evidence of arthropathy or other focal bone abnormality. Soft tissues are unremarkable. IMPRESSION: No acute abnormality noted. Electronically Signed   By: Alcide CleverMark  Lukens M.D.   On: 05/10/2019 02:45    Procedures Procedures (including critical care time)  Medications Ordered in ED Medications  fentaNYL (SUBLIMAZE) injection 75 mcg (has no administration in time range)  HYDROcodone-acetaminophen (NORCO/VICODIN) 5-325 MG per tablet 1 tablet (1 tablet Oral Given 05/10/19 0052)  iohexol (OMNIPAQUE) 300 MG/ML solution 100 mL (100 mLs Intravenous Contrast Given 05/10/19 0251)  HYDROmorphone (DILAUDID) injection 0.5 mg (0.5 mg Intravenous Given 05/10/19 0350)  ketorolac (TORADOL) 30 MG/ML injection 30 mg (30 mg Intravenous Given 05/10/19 0445)  methocarbamol (ROBAXIN) tablet 500 mg (500 mg Oral Given 05/10/19 0452)     Initial Impression / Assessment and Plan / ED Course  I have reviewed the triage vital signs and the nursing notes.  Pertinent labs & imaging results that were available during my care of the patient were reviewed by me and considered in my medical decision making (see chart for details).        38 year old female with history of asthma and GERD presenting with right flank, back, and abdominal pain as well as right great toe pain after a fall onto her right side down 3 flights of stairs earlier today.  She took ibuprofen at home with no improvement in her symptoms.  Vital signs  are stable and reassuring.  On initial exam, cervical, thoracic, and lumbar spine are unremarkable.  Given her tenderness to the right mid and low back as well as the right upper quadrant, I am concerned for a liver laceration or retroperitoneal bleed.  Creatinine is unremarkable.  Hemoglobin and hematocrit are stable.  CT lumbar spine with a minimally displaced fracture of the right transverse process of L1.  The findings were discussed with the patient and her stepdaughter.  Discussed the findings with Dr. Bebe ShaggyWickline, attending physician.   The patient was given Dilaudid, Robaxin, and Toradol in the ER with significant improvement in pain control.  Will discharge to home with pain medication and outpatient neurosurgery follow-up.  She is hemodynamically stable and in no acute distress.  ER return precautions given.  Safe for discharge home with outpatient follow-up.  Final Clinical Impressions(s) / ED Diagnoses   Final diagnoses:  Fall  Closed fracture of transverse process of lumbar vertebra, initial encounter (HCC)  Contusion of flank, initial encounter    ED Discharge Orders         Ordered    predniSONE (STERAPRED UNI-PAK 21 TAB) 10 MG (21) TBPK tablet  Daily     05/10/19 0548    oxyCODONE-acetaminophen (PERCOCET/ROXICET) 5-325 MG tablet  Every 8 hours PRN     05/10/19 0548    methocarbamol (ROBAXIN) 500 MG tablet  2 times daily     05/10/19 0548           Jaiveon Suppes, Pedro EarlsMia A, PA-C 05/10/19 0557    Zadie RhineWickline, Donald, MD 05/10/19 [phone]

## 2019-05-10 NOTE — Discharge Instructions (Addendum)
Gracias por permitirme atenderlo [employer] en el Departamento de Emergencias.  Llame para programar la siguiente cita con el Dr. Lovell SheehanJenkins con neurociruga.  Tome 650 mg de Tylenol o 600 mg de ibuprofeno con alimentos cada 6 horas para el dolor. Puede alternar entre estos 2 medicamentos cada 3 horas si su dolor regresa. Por ejemplo, puede tomar Tylenol al medioda, seguido de una dosis de ibuprofeno a las 3, seguida de Neomia Dearuna segunda dosis de Tylenol y 6.  Para el dolor intenso e incontrolable, puede tomar 1 tableta de Percocet cada 8 horas. Cada comprimido de Percocet contiene 325 mg de Tylenol. No tome ms de 4000 mg de Tylenol de todas las fuentes en un perodo de 24 horas. Es narctico y puede causarle somnolencia, as que no trabaje ni Paediatric nurseconduzca mientras est tomando [employer] Corporationeste medicamento. Tambin puede tomar 1 tableta de Robaxin hasta 2 veces al C.H. Robinson Worldwideda. Robaxin como relajante muscular. Tambin puede causarle somnolencia, por lo que no debe tomarlo cerca de la hora en que toma Percocet. Tampoco debe trabajar ni conducir mientras toma este medicamento.  La prednisona es un esteroide. Puede ayudar a disminuir la inflamacin alrededor del hueso roto. Tomar segn lo prescrito. Aplique compresas de hielo o almohadillas trmicas durante 15 a 20 minutos con la frecuencia necesaria para Engineer, materialsaliviar el dolor. Los parches de lidocana tambin estn disponibles sin receta y se pueden Contractoraplicar en las reas doloridas. Si [place] parches de [place], es muy importante que no aplique una almohadilla trmica directamente sobre el rea porque puede causar quemaduras.  Regrese a la sala de emergencias si tiene una nueva cada o lesin, presenta un nuevo entumecimiento o debilidad en las piernas, si orina o defeca, o presenta otros sntomas nuevos que le preocupen.  Thank you for allowing me to care for you today in the Emergency Department.   Please call to schedule follow appointment with Dr. Lovell SheehanJenkins with neurosurgery.  Take 650 mg of  Tylenol or 600 mg of ibuprofen with food every 6 hours for pain.  You can alternate between these 2 medications every 3 hours if your pain returns.  For instance, you can take Tylenol at noon, followed by a dose of ibuprofen at 3, followed by second dose of Tylenol and 6.  For severe, uncontrollable pain, you can take 1 tablet of Percocet every 8 hours.  Each tablet of Percocet contains 325 mg of Tylenol.  Do not take more than 4000 mg of Tylenol from all sources in a 24-hour period.  It is narcotic and can make you drowsy so do not work or drive while taking this medication.  You can also take 1 tablet of Robaxin up to 2 times daily.  Robaxin as a muscle relaxer.  It can also make you drowsy so you should not take it around the time that you take Percocet.  You should also not work or drive while taking this medication.  Prednisone is a steroid.  It can help to decrease the inflammation around the broken bone.  Take as prescribed.  Apply ice packs or heating pads for 15 to 20 minutes as frequently as needed help with your pain.  Lidocaine patches are also available over-the-counter and can be applied to areas that are sore.  If you use lidocaine patches, it is very important to not apply a heating pad directly over to the area because it can cause burns.  Return to the emergency department if you have a new fall or injury, develop new numbness or weakness in  your legs, if you pee or poop on yourself, or develop other new, concerning symptoms.

## 2020-11-09 ENCOUNTER — Emergency Department (HOSPITAL_COMMUNITY)
Admission: EM | Admit: 2020-11-09 | Discharge: 2020-11-10 | Disposition: A | Discharge status: Home / Self Care | Payer: Self-pay | Attending: Emergency Medicine | Admitting: Emergency Medicine

## 2020-11-09 ENCOUNTER — Other Ambulatory Visit: Payer: Self-pay

## 2020-11-09 DIAGNOSIS — W109XXA Fall (on) (from) unspecified stairs and steps, initial encounter: Secondary | ICD-10-CM | POA: Insufficient documentation

## 2020-11-09 DIAGNOSIS — Z20822 Contact with and (suspected) exposure to covid-19: Secondary | ICD-10-CM | POA: Insufficient documentation

## 2020-11-09 DIAGNOSIS — M549 Dorsalgia, unspecified: Secondary | ICD-10-CM | POA: Insufficient documentation

## 2020-11-09 DIAGNOSIS — Z7722 Contact with and (suspected) exposure to environmental tobacco smoke (acute) (chronic): Secondary | ICD-10-CM | POA: Insufficient documentation

## 2020-11-09 DIAGNOSIS — Z9049 Acquired absence of other specified parts of digestive tract: Secondary | ICD-10-CM | POA: Insufficient documentation

## 2020-11-09 DIAGNOSIS — K219 Gastro-esophageal reflux disease without esophagitis: Secondary | ICD-10-CM | POA: Insufficient documentation

## 2020-11-09 DIAGNOSIS — N739 Female pelvic inflammatory disease, unspecified: Secondary | ICD-10-CM | POA: Insufficient documentation

## 2020-11-09 DIAGNOSIS — J029 Acute pharyngitis, unspecified: Secondary | ICD-10-CM | POA: Insufficient documentation

## 2020-11-09 DIAGNOSIS — J45909 Unspecified asthma, uncomplicated: Secondary | ICD-10-CM | POA: Insufficient documentation

## 2020-11-09 DIAGNOSIS — D72829 Elevated white blood cell count, unspecified: Secondary | ICD-10-CM | POA: Insufficient documentation

## 2020-11-09 DIAGNOSIS — N730 Acute parametritis and pelvic cellulitis: Secondary | ICD-10-CM

## 2020-11-09 DIAGNOSIS — R102 Pelvic and perineal pain: Secondary | ICD-10-CM

## 2020-11-09 DIAGNOSIS — W19XXXA Unspecified fall, initial encounter: Secondary | ICD-10-CM

## 2020-11-09 LAB — URINALYSIS, ROUTINE W REFLEX MICROSCOPIC
Bacteria, UA: NONE SEEN
Bilirubin Urine: NEGATIVE
Glucose, UA: NEGATIVE mg/dL
Ketones, ur: 5 mg/dL — AB
Leukocytes,Ua: NEGATIVE
Nitrite: NEGATIVE
Protein, ur: NEGATIVE mg/dL
RBC / HPF: >50 RBC/hpf — ABNORMAL HIGH (ref 0–5)
Specific Gravity, Urine: 1.016 (ref 1.005–1.030)
pH: 6 (ref 5.0–8.0)

## 2020-11-09 LAB — CBC
HCT: 35.5 % — ABNORMAL LOW (ref 36.0–46.0)
Hemoglobin: 11.5 g/dL — ABNORMAL LOW (ref 12.0–15.0)
MCH: 29.8 pg (ref 26.0–34.0)
MCHC: 32.4 g/dL (ref 30.0–36.0)
MCV: 92 fL (ref 80.0–100.0)
Platelets: 375 10*3/uL (ref 150–400)
RBC: 3.86 MIL/uL — ABNORMAL LOW (ref 3.87–5.11)
RDW: 13.8 % (ref 11.5–15.5)
WBC: 12.8 10*3/uL — ABNORMAL HIGH (ref 4.0–10.5)
nRBC: 0 % (ref 0.0–0.2)

## 2020-11-09 LAB — I-STAT BETA HCG BLOOD, ED (MC, WL, AP ONLY): I-stat hCG, quantitative: <5 m[IU]/mL (ref <5)

## 2020-11-09 MED ORDER — ACETAMINOPHEN 325 MG PO TABS
650.0000 mg | ORAL_TABLET | Freq: Once | ORAL | Status: AC | PRN
  Administered 2020-11-09: 650 mg via ORAL
  Filled 2020-11-09: qty 2

## 2020-11-09 NOTE — ED Triage Notes (Signed)
Pt reports  Larey Seat down the stair on Saturday and hit her back and reports severe back pain that radiate to her abd. Pt also reports last week she had some vagina discharge and describe it as cottage cheese  Then change to yellow color and states swollen to her right ear lymph nodes

## 2020-11-10 ENCOUNTER — Emergency Department (HOSPITAL_COMMUNITY): Payer: Self-pay

## 2020-11-10 LAB — WET PREP, GENITAL
Clue Cells Wet Prep HPF POC: NONE SEEN
Sperm: NONE SEEN
Trich, Wet Prep: NONE SEEN
Yeast Wet Prep HPF POC: NONE SEEN

## 2020-11-10 LAB — COMPREHENSIVE METABOLIC PANEL
ALT: 18 U/L (ref 0–44)
AST: 19 U/L (ref 15–41)
Albumin: 3.9 g/dL (ref 3.5–5.0)
Alkaline Phosphatase: 74 U/L (ref 38–126)
Anion gap: 8 (ref 5–15)
BUN: 7 mg/dL (ref 6–20)
CO2: 25 mmol/L (ref 22–32)
Calcium: 9.5 mg/dL (ref 8.9–10.3)
Chloride: 103 mmol/L (ref 98–111)
Creatinine, Ser: 0.72 mg/dL (ref 0.44–1.00)
GFR, Estimated: >60 mL/min (ref >60)
Glucose, Bld: 97 mg/dL (ref 70–99)
Potassium: 3.6 mmol/L (ref 3.5–5.1)
Sodium: 136 mmol/L (ref 135–145)
Total Bilirubin: 0.7 mg/dL (ref 0.3–1.2)
Total Protein: 7.8 g/dL (ref 6.5–8.1)

## 2020-11-10 LAB — GROUP A STREP BY PCR: Group A Strep by PCR: NOT DETECTED

## 2020-11-10 LAB — LIPASE, BLOOD: Lipase: 25 U/L (ref 11–51)

## 2020-11-10 LAB — SARS CORONAVIRUS 2 (TAT 6-24 HRS): SARS Coronavirus 2: NEGATIVE

## 2020-11-10 MED ORDER — METHOCARBAMOL 500 MG PO TABS: 500.0000 mg | ORAL_TABLET | Freq: Two times a day (BID) | ORAL | 0 refills | Status: AC

## 2020-11-10 MED ORDER — KETOROLAC TROMETHAMINE 60 MG/2ML IM SOLN: 60.0000 mg | Freq: Once | INTRAMUSCULAR | Status: DC

## 2020-11-10 MED ORDER — KETOROLAC TROMETHAMINE 60 MG/2ML IM SOLN
60.0000 mg | Freq: Once | INTRAMUSCULAR | Status: AC
  Administered 2020-11-10: 60 mg via INTRAMUSCULAR
  Filled 2020-11-10: qty 2

## 2020-11-10 MED ORDER — IOHEXOL 300 MG/ML  SOLN
100.0000 mL | Freq: Once | INTRAMUSCULAR | Status: AC | PRN
  Administered 2020-11-10: 100 mL via INTRAVENOUS

## 2020-11-10 MED ORDER — LIDOCAINE HCL (PF) 1 % IJ SOLN
INTRAMUSCULAR | Status: AC
  Administered 2020-11-10: 1 mL
  Filled 2020-11-10: qty 5

## 2020-11-10 MED ORDER — DOXYCYCLINE HYCLATE 100 MG PO CAPS: 100.0000 mg | ORAL_CAPSULE | Freq: Two times a day (BID) | ORAL | 0 refills | Status: AC

## 2020-11-10 MED ORDER — MORPHINE SULFATE (PF) 2 MG/ML IV SOLN
2.0000 mg | Freq: Once | INTRAVENOUS | Status: AC
Start: 2020-11-10 — End: 2020-11-10
  Administered 2020-11-10: 2 mg via INTRAVENOUS
  Filled 2020-11-10: qty 1

## 2020-11-10 MED ORDER — CEFTRIAXONE SODIUM 1 G IJ SOLR
1.0000 g | Freq: Once | INTRAMUSCULAR | Status: AC
  Administered 2020-11-10: 1 g via INTRAMUSCULAR
  Filled 2020-11-10: qty 10

## 2020-11-10 MED ORDER — NAPROXEN 375 MG PO TABS: 375.0000 mg | ORAL_TABLET | Freq: Two times a day (BID) | ORAL | 0 refills | Status: AC

## 2020-11-10 NOTE — ED Provider Notes (Signed)
[name] EMERGENCY DEPARTMENT Provider Note   CSN: [id number] Arrival date & time: 11/09/20  2248     History Chief Complaint  Patient presents with  . Abdominal Pain  . Back Pain  . Vaginal Discharge    Abigail Rubio is a 40 y.o. female.  HPI 40 year old female who presents to the ER with multiple complaints.  Patient does some English, but history provided with the assistance of a Spanish interpreter.    Patient that she started to develop some pelvic pain and "carotids cheese" discharge approximately a week ago.  She states that then it became more yellow.  She also is feels like she is very irritated down the vaginal area.  She is sexually active with one partner and they do not use the barrier method.  She denies any nausea or vomiting, but does endorse lower pelvic pain.   She also states that she woke up this morning with a sore throat and feels like she has a "mass in her neck".  She states that she feels like when she swallows the "mass will go down, but then will pop back up".  She denies any drooling or inability to swallow, but does have pain with swallowing.  She denies any fevers or chills.  No cough.  She is not vaccinated for COVID.  Patient also states that she fell down some steps on Saturday, and landed on her gluteal/lower back.  She has had pain since then.  She has taken ibuprofen with little relief.  No red flag signs, no loss of bowel bladder control, no foot drop, no numbness or tingling, but states that she has had a fall before and is concerned because she came here and was diagnosed with some fractures in her spine.    Past Medical History:  Diagnosis Date  . Anemia   . Asthma   . Asthma 1980  . Depression    PP  . GERD (gastroesophageal reflux disease)   . History of positive PPD 2007   negative chest X ray completed tx 2007  . Hx of hepatitis   . Language barrier   . Late prenatal care   . Obesity   . Trauma to ear     left  . Vaginal Pap smear, abnormal     Patient Active Problem List   Diagnosis Date Noted  . Pregnancy 09/07/2015  . Post term pregnancy, 41 weeks 09/07/2015  . NSVD (normal spontaneous vaginal delivery) 09/07/2015  . Abdominal pain in pregnancy   . Previous cesarean delivery, antepartum condition or complication 12/21/2012    Past Surgical History:  Procedure Laterality Date  . apendectomy  2003  . APPENDECTOMY    . CESAREAN SECTION    . CESAREAN SECTION  2004  . COLPOSCOPY  2004  . GYNECOLOGIC CRYOSURGERY       OB History    Gravida  6   Para  5   Term  5   Preterm      AB  1   Living  5     SAB  1   IAB      Ectopic      Multiple  0   Live Births  5           Family History  Problem Relation Age of Onset  . Kidney disease Sister   . Urolithiasis Sister   . Cancer Maternal Aunt        breast  . Heart disease  Maternal Aunt   . Birth defects Maternal Uncle        hole in heart  . Hypertension Maternal Grandmother   . Anemia Maternal Grandmother   . Hyperlipidemia Maternal Grandfather   . Diabetes Maternal Grandfather   . Anemia Mother     Social History   Tobacco Use  . Smoking status: Passive Smoke Exposure - Never Smoker  . Smokeless tobacco: Never Used  Substance Use Topics  . Alcohol use: No  . Drug use: No    Home Medications Prior to Admission medications   Medication Sig Start Date End Date Taking? Authorizing Provider  doxycycline (VIBRAMYCIN) 100 MG capsule Take 1 capsule (100 mg total) by mouth 2 (two) times daily for 7 days. 11/10/20 11/17/20 Yes Mare Ferrari, PA-C  methocarbamol (ROBAXIN) 500 MG tablet Take 1 tablet (500 mg total) by mouth 2 (two) times daily. 11/10/20  Yes Mare Ferrari, PA-C  naproxen (NAPROSYN) 375 MG tablet Take 1 tablet (375 mg total) by mouth 2 (two) times daily. 11/10/20  Yes Mare Ferrari, PA-C  acetaminophen (TYLENOL) 325 MG tablet Take 2 tablets (650 mg total) by mouth every 4 (four) hours  as needed (for pain scale < 4). Patient not taking: Reported on 11/10/2020 09/09/15   Palma Holter, MD  docusate sodium (COLACE) 100 MG capsule Take 1 capsule (100 mg total) by mouth 2 (two) times daily. Patient not taking: Reported on 11/10/2020 09/09/15   Palma Holter, MD  HYDROcodone-acetaminophen (NORCO/VICODIN) 5-325 MG tablet Take 1 tablet by mouth every 4 (four) hours as needed. Patient not taking: Reported on 11/10/2020 05/09/18   Garlon Hatchet, PA-C  ibuprofen (ADVIL,MOTRIN) 600 MG tablet Take 1 tablet (600 mg total) by mouth every 6 (six) hours as needed. Patient not taking: No sig reported 09/09/15   Palma Holter, MD  ondansetron (ZOFRAN ODT) 4 MG disintegrating tablet Take 1 tablet (4 mg total) by mouth every 8 (eight) hours as needed for nausea. Patient not taking: No sig reported 05/09/18   Garlon Hatchet, PA-C  oxyCODONE-acetaminophen (PERCOCET/ROXICET) 5-325 MG tablet Take 1 tablet by mouth every 8 (eight) hours as needed for severe pain. Patient not taking: No sig reported 05/10/19   McDonald, Mia A, PA-C    Allergies    Pork-derived products  Review of Systems   Review of Systems  Constitutional: Positive for chills. Negative for fever.  HENT: Positive for sore throat. Negative for ear pain and voice change.   Eyes: Negative for pain and visual disturbance.  Respiratory: Negative for cough and shortness of breath.   Cardiovascular: Negative for chest pain and palpitations.  Gastrointestinal: Negative for abdominal pain and vomiting.  Genitourinary: Positive for pelvic pain and vaginal discharge. Negative for dysuria and hematuria.  Musculoskeletal: Positive for back pain. Negative for arthralgias, neck pain and neck stiffness.  Skin: Negative for color change and rash.  Neurological: Negative for seizures and syncope.  All other systems reviewed and are negative.   Physical Exam Updated Vital Signs BP 118/82   Pulse (!) 57   Temp 98.3 F (36.8  C) (Axillary)   Resp 13   Ht  (1.575 m)   Wt 83.9 kg   LMP 11/05/2020   SpO2 97%   BMI 33.84 kg/m   Physical Exam Vitals and nursing note reviewed.  Constitutional:      General: She is not in acute distress.    Appearance: She is well-developed. She is not diaphoretic.  HENT:     Head: Normocephalic and atraumatic.     Nose: Nose normal.     Mouth/Throat:     Mouth: Mucous membranes are moist.     Pharynx: Oropharynx is clear.     Comments: Oropharynx mildly erythematous, without exudates, uvula midline, no unilateral tonsillar swelling, tongue normal size and midline, no sublingual/submandibular swellimg, tolerating secretions well.  She does have significant submandibular and posterior cervical chain lymphadenopathy on the left side.  No overlying erythema, warmth, no tripoding, no voice change   Eyes:     Conjunctiva/sclera: Conjunctivae normal.  Cardiovascular:     Rate and Rhythm: Normal rate and regular rhythm.     Pulses: Normal pulses.     Heart sounds: Normal heart sounds. No murmur heard.   Pulmonary:     Effort: Pulmonary effort is normal. No respiratory distress.     Breath sounds: Normal breath sounds.  Abdominal:     General: Abdomen is flat.     Palpations: Abdomen is soft.     Tenderness: There is abdominal tenderness. There is right CVA tenderness, left CVA tenderness and guarding. Negative signs include Murphy's sign and McBurney's sign.     Hernia: No hernia is present.     Comments: Significant guarding and tenderness in the lower abdomen.  Nonfocal.  She does have bilateral flank tenderness on exam as well.  Genitourinary:    Cervix: Cervical motion tenderness present.     Uterus: Absent.      Adnexa:        Right: No tenderness.         Left: Tenderness present.      Comments: Pelvic exam performed with RN at bedside.  Patient currently on her menstrual cycle with copious mount of blood in the vaginal vault.  She did have cervical motion  tenderness and left adnexal tenderness on exam. Musculoskeletal:        General: Tenderness present. No deformity.     Cervical back: Neck supple.     Comments: Some midline tenderness to the L-spine, and S I joints of the bilateral hips.  She did ambulate in the ER without difficulty.  She has no step-offs or crepitus, no overlying skin changes.  Skin:    General: Skin is warm and dry.     Findings: No erythema.  Neurological:     General: No focal deficit present.     Mental Status: She is alert and oriented to person, place, and time.  Psychiatric:        Mood and Affect: Mood normal.        Behavior: Behavior normal.     ED Results / Procedures / Treatments   Labs (all labs ordered are listed, but only abnormal results are displayed) Labs Reviewed  WET PREP, GENITAL - Abnormal; Notable for the following components:      Result Value   WBC, Wet Prep HPF POC FEW (*)    All other components within normal limits  CBC - Abnormal; Notable for the following components:   WBC 12.8 (*)    RBC 3.86 (*)    Hemoglobin 11.5 (*)    HCT 35.5 (*)    All other components within normal limits  URINALYSIS, ROUTINE W REFLEX MICROSCOPIC - Abnormal; Notable for the following components:   Hgb urine dipstick LARGE (*)    Ketones, ur 5 (*)    RBC / HPF >50 (*)    All other components within normal limits  SARS CORONAVIRUS 2 (TAT 6-24 HRS)  GROUP A STREP BY PCR  LIPASE, BLOOD  COMPREHENSIVE METABOLIC PANEL  I-STAT BETA HCG BLOOD, ED ([facility], [facility], [facility] ONLY)  GC/CHLAMYDIA PROBE AMP ([place]) NOT [facility]    EKG None  Radiology DG Lumbar Spine Complete  Result Date: 11/10/2020 CLINICAL DATA:  Recent fall with low back pain, initial encounter EXAM: LUMBAR SPINE - COMPLETE 4+ VIEW COMPARISON:  None. FINDINGS: Five lumbar type vertebral bodies are well visualized. Vertebral body height is well maintained. No pars defects are noted. No anterolisthesis is seen. No soft tissue abnormality is noted.  IUD is noted within the pelvis. IMPRESSION: No acute abnormality noted. Electronically Signed   By: Alcide CleverMark  Lukens M.D.   On: 11/10/2020 11:49   DG Sacrum/Coccyx  Result Date: 11/10/2020 CLINICAL DATA:  Recent fall with pelvic pain, initial encounter EXAM: SACRUM AND COCCYX - 2+ VIEW COMPARISON:  None. FINDINGS: Sacral ala are intact. IUD is noted in place. Pelvic ring is well visualized and within normal limits. No fracture is noted. IMPRESSION: No acute abnormality seen. Electronically Signed   By: Alcide CleverMark  Lukens M.D.   On: 11/10/2020 11:53   CT CHEST ABDOMEN PELVIS W CONTRAST  Result Date: 11/10/2020 CLINICAL DATA:  Fall, back and flank pain EXAM: CT CHEST, ABDOMEN, AND PELVIS WITH CONTRAST TECHNIQUE: Multidetector CT imaging of the chest, abdomen and pelvis was performed following the standard protocol during bolus administration of intravenous contrast. CONTRAST:  100mL OMNIPAQUE IOHEXOL 300 MG/ML  SOLN COMPARISON:  None. FINDINGS: CT CHEST FINDINGS Cardiovascular: Normal heart size. No pericardial effusion. Thoracic aorta is normal in caliber. Mediastinum/Nodes: No mediastinal hematoma. No enlarged lymph nodes. Normal thyroid. Lungs/Pleura: No consolidation or mass. No pleural effusion or pneumothorax. Minor bibasilar atelectasis. Musculoskeletal: No acute fracture. CT ABDOMEN PELVIS FINDINGS Hepatobiliary: No hepatic injury or perihepatic hematoma. Gallbladder is unremarkable Pancreas: Unremarkable. Spleen: No splenic injury or perisplenic hematoma. Adrenals/Urinary Tract: No adrenal hemorrhage or renal injury identified. Bladder is unremarkable. Stomach/Bowel: Stomach is within normal limits. Bowel is normal in caliber. Few distal colonic diverticula. Vascular/Lymphatic: No significant vascular abnormality. No enlarged lymph nodes. Reproductive: Intrauterine device is present.  No adnexal mass. Other: No free fluid. Mild subcutaneous fat infiltration with foci of air at the level of the right lower back  and upper gluteal region. Musculoskeletal: No acute fracture. IMPRESSION: No evidence of acute visceral injury.  No acute fracture. At the level of the right lower back and upper gluteal region, possibly post-traumatic mild fat infiltration and foci of air. Electronically Signed   By: Guadlupe SpanishPraneil  Patel M.D.   On: 11/10/2020 15:24   CT T-SPINE NO CHARGE  Result Date: 11/10/2020 CLINICAL DATA:  Fall, back pain EXAM: CT Thoracic and Lumbar spine with contrast TECHNIQUE: Multiplanar CT images of the thoracic and lumbar spine were reconstructed from contemporary CT of the Chest, Abdomen, and Pelvis CONTRAST:  No additional COMPARISON:  05/10/2019 FINDINGS: CT THORACIC SPINE FINDINGS Alignment: No new listhesis. Vertebrae: Stable vertebral body heights.  No acute fracture. Paraspinal and other soft tissues: Extra-spinal findings better evaluated on concurrent dedicated imaging. Disc levels: Intervertebral disc heights are preserved. There is no significant degenerative stenosis identified. CT LUMBAR SPINE FINDINGS Segmentation: There are 13 rib-bearing vertebral bodies. Five lumbar type non-rib-bearing vertebral bodies are present. Alignment: No new listhesis. Vertebrae: Stable vertebral body heights. No acute fracture. Healing of previously seen right L1 transverse process fracture. Paraspinal and other soft tissues: Extra-spinal findings are better evaluated on concurrent dedicated imaging.  Disc levels: Intervertebral disc heights are preserved. There is no significant degenerative stenosis identified. IMPRESSION: No acute fracture of the thoracolumbar spine. Electronically Signed   By: Guadlupe Spanish M.D.   On: 11/10/2020 15:14   CT L-SPINE NO CHARGE  Result Date: 11/10/2020 CLINICAL DATA:  Fall, back pain EXAM: CT Thoracic and Lumbar spine with contrast TECHNIQUE: Multiplanar CT images of the thoracic and lumbar spine were reconstructed from contemporary CT of the Chest, Abdomen, and Pelvis CONTRAST:  No  additional COMPARISON:  05/10/2019 FINDINGS: CT THORACIC SPINE FINDINGS Alignment: No new listhesis. Vertebrae: Stable vertebral body heights.  No acute fracture. Paraspinal and other soft tissues: Extra-spinal findings better evaluated on concurrent dedicated imaging. Disc levels: Intervertebral disc heights are preserved. There is no significant degenerative stenosis identified. CT LUMBAR SPINE FINDINGS Segmentation: There are 13 rib-bearing vertebral bodies. Five lumbar type non-rib-bearing vertebral bodies are present. Alignment: No new listhesis. Vertebrae: Stable vertebral body heights. No acute fracture. Healing of previously seen right L1 transverse process fracture. Paraspinal and other soft tissues: Extra-spinal findings are better evaluated on concurrent dedicated imaging. Disc levels: Intervertebral disc heights are preserved. There is no significant degenerative stenosis identified. IMPRESSION: No acute fracture of the thoracolumbar spine. Electronically Signed   By: Guadlupe Spanish M.D.   On: 11/10/2020 15:14   US PELVIC COMPLETE W TRANSVAGINAL AND TORSION R/O  Result Date: 11/10/2020 CLINICAL DATA:  Left adnexal tenderness EXAM: TRANSABDOMINAL AND TRANSVAGINAL ULTRASOUND OF PELVIS DOPPLER ULTRASOUND OF OVARIES TECHNIQUE: Both transabdominal and transvaginal ultrasound examinations of the pelvis were performed. Transabdominal technique was performed for global imaging of the pelvis including uterus, ovaries, adnexal regions, and pelvic cul-de-sac. It was necessary to proceed with endovaginal exam following the transabdominal exam to visualize the ovaries bilaterally. Color and duplex Doppler ultrasound was utilized to evaluate blood flow to the ovaries. COMPARISON:  None. FINDINGS: Uterus Measurements: 9.6 x 4.8 x 5.4 cm = volume: 128 mL. No fibroids or other mass visualized. Intrauterine device in expected position within the endometrial cavity. The cervix is unremarkable. Endometrium Thickness:  Obscured by intrauterine device. No gross thickening, however, is identified. Right ovary Measurements: 3.2 x 1.6 x 2.1 cm = volume: 6 mL. Normal appearance/no adnexal mass. Left ovary Measurements: 5.3 x 2.9 x 2.9 cm = volume: 23 mL. Normal appearance/no adnexal mass. 2.3 cm dominant follicle noted within the left ovary. Pulsed Doppler evaluation of both ovaries demonstrates normal low-resistance arterial and venous waveforms. Other findings No abnormal free fluid. IMPRESSION: Intrauterine device in expected position within the endometrial cavity. Otherwise normal pelvic sonogram. Electronically Signed   By: Helyn Numbers MD   On: 11/10/2020 13:14   DG HIP UNILAT WITH PELVIS 2-3 VIEWS LEFT  Result Date: 11/10/2020 CLINICAL DATA:  Recent fall with left hip pain, initial encounter EXAM: DG HIP (WITH OR WITHOUT PELVIS) 2V LEFT COMPARISON:  None. FINDINGS: Visualized pelvic ring is intact. No acute fracture or dislocation is noted. No soft tissue abnormality is seen. IUD is noted in the pelvis. IMPRESSION: No acute abnormality noted. Electronically Signed   By: Alcide Clever M.D.   On: 11/10/2020 11:50   DG HIP UNILAT WITH PELVIS 2-3 VIEWS RIGHT  Result Date: 11/10/2020 CLINICAL DATA:  Recent fall with pelvic pain, initial encounter EXAM: DG HIP (WITH OR WITHOUT PELVIS) 3V RIGHT COMPARISON:  None. FINDINGS: Pelvic ring as visualized is within normal limits. No acute fracture or dislocation is seen. No soft tissue abnormality is noted. IMPRESSION: No acute abnormality noted. Electronically Signed  By: Alcide Clever M.D.   On: 11/10/2020 11:50    Procedures Procedures   Medications Ordered in ED Medications  acetaminophen (TYLENOL) tablet 650 mg (650 mg Oral Given 11/09/20 2303)  ketorolac (TORADOL) injection 60 mg (60 mg Intramuscular Given 11/10/20 1043)  cefTRIAXone (ROCEPHIN) injection 1 g (1 g Intramuscular Given 11/10/20 1308)  lidocaine (PF) (XYLOCAINE) 1 % injection (1 mL  Given 11/10/20 1308)   morphine 2 MG/ML injection 2 mg (2 mg Intravenous Given 11/10/20 1348)  iohexol (OMNIPAQUE) 300 MG/ML solution 100 mL (100 mLs Intravenous Contrast Given 11/10/20 1456)    ED Course  I have reviewed the triage vital signs and the nursing notes.  Pertinent labs & imaging results that were available during my care of the patient were reviewed by me and considered in my medical decision making (see chart for details).    MDM Rules/Calculators/A&P                          40 year old female who presents with multiple complaints. On arrival, she is well-appearing, still uncomfortable.,  No acute distress, resting comfortably in ER bed.  Vitals on arrival overall reassuring, afebrile, not tachycardic, tachypneic or hypoxic.  Physical exam with guarding to the lower abdomen, with nonfocal tenderness.  Pelvic exam with evidence of current menstrual cycle, with cervical motion tenderness and left adnexal tenderness on exam.  She also has bilateral flank tenderness on exam and some midline tenderness to the L-spine.  Question renal stones versus muscle soreness after fall.  She did ambulate in the ER without difficulty.  She has no evidence of red flag signs, no evidence of cauda equina.  Her work-up here showed a CBC with a leukocytosis of 12.8, hemoglobin 11.5.  CMP largely unremarkable.  Her UA shows large amount of hemoglobin and more than 50 RBCs.  Lipase is normal.  Pregnancy is negative.  Covid test is negative.  Strep is negative   Wet prep with few WBCs, no trichomoniasis, yeast.  However, given cervical motion tenderness, patient was treated prophylactically for PID with Rocephin, will send home with Doxy.  Plain films of the lumbar spine, pelvis and coccyx without evidence of fractures.  Given his significant left sided adnexal tenderness, and ultrasound was ordered to rule out cyst or torsion.  This confirmed placement of the IUD with no other abnormalities.  I discussed the reassuring  findings with the patient, however she continued to complain of back pain.  She continued to have flank tenderness and midline tenderness.  Particular shared decision-making process, she would like to proceed with CT scan.  As per discussion with Dr. Deretha Emory, a CT scan of the chest, abdomen, pelvis to evaluate thoracic, lumbar and pelvis/abdomen to rule out kidney stones or any other intra-abdominal pathology.  This is overall normal.  CT of the abdomen did comment on some mild fat infiltration and foci of air to the right lower back and upper gluteal region, likely consistent with where her pain is and suspected secondary due to the fall.  Overall work-up extensive and reassuring.  Patient was given Toradol and morphine here for pain.  Will send home with doxycycline, patient was instructed to have all partners tested, seen from sex for 2 weeks, instructed to follow-up on the results of her gonorrhea and Chlamydia test via MyChart.  Will send home with muscle relaxers, naproxen.  Patient is uninsured, does not have a PCP.  Stressed follow-up with [facility] for further management of her symptoms.  Return precautions discussed.  She voiced understanding and is agreeable.  Case discussed with Dr. Deretha Emory who is agreeable to the plan and disposition  Final Clinical Impression(s) / ED Diagnoses Final diagnoses:  Left adnexal tenderness  Fall  PID (acute pelvic inflammatory disease)    Rx / DC Orders ED Discharge Orders         Ordered    doxycycline (VIBRAMYCIN) 100 MG capsule  2 times daily        11/10/20 1529    naproxen (NAPROSYN) 375 MG tablet  2 times daily        11/10/20 1529    methocarbamol (ROBAXIN) 500 MG tablet  2 times daily        11/10/20 1529           Leone Brand 11/10/20 1553    Vanetta Mulders, MD 11/11/20 [phone]

## 2020-11-10 NOTE — ED Notes (Signed)
Pt in ultrasound

## 2020-11-10 NOTE — Discharge Instructions (Addendum)
Su evaluacin de hoy fue en general tranquilizadora. Su examen fsico plvico sugiri algo llamado enfermedad plvica inflamatoria. Esto podra ser causado por una posible ETS. Los resultados de su prueba volvern a travs de Magazine features editor MyChart (hay instrucciones en su papeleo de alta sobre cmo descargar esto) en al menos 24 a 48 horas, sin embargo, recibi tratamiento aqu hoy y le dieron antibiticos adicionales para llevar a casa para tratar esto justo en caso. Tome los antibiticos recetados hasta que termine. Es probable que su dolor de espalda se deba a Nurse, children's, puede tomar naproxeno, que es un antiinflamatorio, as como un Programmer, systems, segn sea necesario. Tome el relajante muscular por la noche y no beba ni conduzca con los medicamentos. Por lo dems, sus escaneos fueron normales, sin evidencia de fracturas en su columna. Asegrese de Radio producer un seguimiento con la salud y Counsellor de la comunidad de [place], que es una clnica gratuita aqu en el rea. Regrese a la sala de emergencias por cualquier sntoma nuevo o que empeore     Your work-up today was overall reassuring.  Your pelvic physical exam was suggestive of something called pelvic inflammatory disease.  This could be caused by a possible STD.  Your test results will come back via the MyChart app (there are instructions on your discharge paperwork on how to download this) in at least 24 to 48 hours, however you were treated here today and given additional antibiotics to take home to treat this just in case.  Please take the prescribed antibiotics until finished.  Your back pain is likely due to muscle ache, you may take the naproxen which is an anti-inflammatory, as well as the muscle relaxer as needed.  Please take the muscle relaxer at night and do not drink or drive in the medications.  Your scans otherwise were normal, no evidence of fractures to your spine.  Please make sure to follow-up with [facility] which is a free clinic here in the area.  Return to the ER for any new or worsening symptoms

## 2020-11-10 NOTE — ED Notes (Signed)
Pt taken to CT.

## 2020-11-11 LAB — GC/CHLAMYDIA PROBE AMP (~~LOC~~) NOT AT ARMC
Chlamydia: NEGATIVE
Comment: NEGATIVE
Comment: NORMAL
Neisseria Gonorrhea: NEGATIVE

## 2024-03-31 ENCOUNTER — Emergency Department (HOSPITAL_COMMUNITY)
Admission: EM | Admit: 2024-03-31 | Discharge: 2024-03-31 | Disposition: A | Discharge status: Home / Self Care | Payer: Self-pay | Attending: Emergency Medicine | Admitting: Emergency Medicine

## 2024-03-31 ENCOUNTER — Encounter (HOSPITAL_COMMUNITY): Payer: Self-pay | Admitting: *Deleted

## 2024-03-31 ENCOUNTER — Emergency Department (HOSPITAL_COMMUNITY): Payer: Self-pay

## 2024-03-31 ENCOUNTER — Other Ambulatory Visit: Payer: Self-pay

## 2024-03-31 DIAGNOSIS — M25512 Pain in left shoulder: Secondary | ICD-10-CM | POA: Insufficient documentation

## 2024-03-31 DIAGNOSIS — R200 Anesthesia of skin: Secondary | ICD-10-CM | POA: Insufficient documentation

## 2024-03-31 DIAGNOSIS — M436 Torticollis: Secondary | ICD-10-CM | POA: Insufficient documentation

## 2024-03-31 DIAGNOSIS — M62838 Other muscle spasm: Secondary | ICD-10-CM | POA: Insufficient documentation

## 2024-03-31 DIAGNOSIS — R202 Paresthesia of skin: Secondary | ICD-10-CM | POA: Insufficient documentation

## 2024-03-31 DIAGNOSIS — M546 Pain in thoracic spine: Secondary | ICD-10-CM | POA: Insufficient documentation

## 2024-03-31 LAB — BASIC METABOLIC PANEL WITH GFR
Anion gap: 8 (ref 5–15)
BUN: 12 mg/dL (ref 6–20)
CO2: 23 mmol/L (ref 22–32)
Calcium: 8.5 mg/dL — ABNORMAL LOW (ref 8.9–10.3)
Chloride: 106 mmol/L (ref 98–111)
Creatinine, Ser: 0.7 mg/dL (ref 0.44–1.00)
GFR, Estimated: >60 mL/min (ref >60)
Glucose, Bld: 100 mg/dL — ABNORMAL HIGH (ref 70–99)
Potassium: 4.7 mmol/L (ref 3.5–5.1)
Sodium: 137 mmol/L (ref 135–145)

## 2024-03-31 LAB — CBC WITH DIFFERENTIAL/PLATELET
Abs Immature Granulocytes: 0.02 K/uL (ref 0.00–0.07)
Basophils Absolute: 0 K/uL (ref 0.0–0.1)
Basophils Relative: 1 %
Eosinophils Absolute: 0.2 K/uL (ref 0.0–0.5)
Eosinophils Relative: 3 %
HCT: 34.9 % — ABNORMAL LOW (ref 36.0–46.0)
Hemoglobin: 11.3 g/dL — ABNORMAL LOW (ref 12.0–15.0)
Immature Granulocytes: 0 %
Lymphocytes Relative: 24 %
Lymphs Abs: 2 K/uL (ref 0.7–4.0)
MCH: 30.6 pg (ref 26.0–34.0)
MCHC: 32.4 g/dL (ref 30.0–36.0)
MCV: 94.6 fL (ref 80.0–100.0)
Monocytes Absolute: 0.6 K/uL (ref 0.1–1.0)
Monocytes Relative: 7 %
Neutro Abs: 5.6 K/uL (ref 1.7–7.7)
Neutrophils Relative %: 65 %
Platelets: 370 K/uL (ref 150–400)
RBC: 3.69 MIL/uL — ABNORMAL LOW (ref 3.87–5.11)
RDW: 13.6 % (ref 11.5–15.5)
WBC: 8.5 K/uL (ref 4.0–10.5)
nRBC: 0 % (ref 0.0–0.2)

## 2024-03-31 LAB — TROPONIN I (HIGH SENSITIVITY)
Troponin I (High Sensitivity): 4 ng/L (ref <18)
Troponin I (High Sensitivity): 5 ng/L (ref <18)

## 2024-03-31 MED ORDER — ONDANSETRON HCL 4 MG/2ML IJ SOLN
4.0000 mg | Freq: Once | INTRAMUSCULAR | Status: AC
Start: 2024-03-31 — End: 2024-03-31
  Administered 2024-03-31: 4 mg via INTRAVENOUS
  Filled 2024-03-31: qty 2

## 2024-03-31 MED ORDER — LIDOCAINE 5 % EX PTCH: 1.0000 | MEDICATED_PATCH | CUTANEOUS | 0 refills | Status: AC

## 2024-03-31 MED ORDER — CYCLOBENZAPRINE HCL 10 MG PO TABS: 10.0000 mg | ORAL_TABLET | Freq: Three times a day (TID) | ORAL | 0 refills | Status: AC | PRN

## 2024-03-31 MED ORDER — KETOROLAC TROMETHAMINE 30 MG/ML IJ SOLN
30.0000 mg | Freq: Once | INTRAMUSCULAR | Status: AC
  Administered 2024-03-31: 30 mg via INTRAVENOUS
  Filled 2024-03-31: qty 1

## 2024-03-31 MED ORDER — IBUPROFEN 800 MG PO TABS: 800.0000 mg | ORAL_TABLET | Freq: Three times a day (TID) | ORAL | 0 refills | Status: AC

## 2024-03-31 MED ORDER — LIDOCAINE 5 % EX PTCH
1.0000 | MEDICATED_PATCH | CUTANEOUS | Status: DC
  Administered 2024-03-31: 1 via TRANSDERMAL
  Filled 2024-03-31: qty 1

## 2024-03-31 MED ORDER — HYDROMORPHONE HCL 1 MG/ML IJ SOLN
0.5000 mg | Freq: Once | INTRAMUSCULAR | Status: AC
  Administered 2024-03-31: 0.5 mg via INTRAVENOUS
  Filled 2024-03-31: qty 1

## 2024-03-31 NOTE — ED Triage Notes (Signed)
 The pt has had neck and lt shoulder and arm pain for 4 days no known injury  tylenol  and naproxen  are not helping    lmp July 20th

## 2024-03-31 NOTE — ED Provider Notes (Signed)
 [facility] Emergency Department Provider Note MRN:  [id number]  Arrival date & time: 03/31/24     Chief Complaint   Torticollis   History of Present Illness   Abigail Rubio is a 43 y.o. year-old female presents to the ED with chief complaint of left shoulder and upper back pain.  She states that the pain radiates into her neck.  She states that she sometimes has some numbness and tingling sensation that runs into her left arm.  She denies any chest pain or shortness of breath.  She denies any injury.  States that she has tried taking Tylenol  and naproxen  without much benefit.  She denies weakness.  She states that each day she feels more more stiff.  Symptoms worsen with head movement..  History provided by patient.   Review of Systems  Pertinent positive and negative review of systems noted in HPI.    Physical Exam   Vitals:   03/31/24 0130 03/31/24 0200  BP: (!) 131/97 128/79  Pulse: 69 68  Resp: 13 13  Temp:    SpO2: 100% 100%    CONSTITUTIONAL: Nontoxic-appearing, NAD NEURO:  Alert and oriented x 3, CN 3-12 grossly intact EYES:  eyes equal and reactive ENT/NECK:  Supple, no stridor  CARDIO: Normal rate, regular rhythm, appears well-perfused PULM:  No respiratory distress, clear to auscultation bilaterally GI/GU:  non-distended, MSK/SPINE:  No gross deformities, no edema, moves all extremities , left side cervical paraspinal muscles are tender to palpation, left upper trapezius has several trigger points and is tender to palpation SKIN:  no rash, atraumatic   *Additional and/or pertinent findings included in MDM below  Diagnostic and Interventional Summary    EKG Interpretation Date/Time:    Ventricular Rate:    PR Interval:    QRS Duration:    QT Interval:    QTC Calculation:   R Axis:      Text Interpretation:         Labs Reviewed  CBC WITH DIFFERENTIAL/PLATELET - Abnormal; Notable for the following components:       Result Value   RBC 3.69 (*)    Hemoglobin 11.3 (*)    HCT 34.9 (*)    All other components within normal limits  BASIC METABOLIC PANEL WITH GFR - Abnormal; Notable for the following components:   Glucose, Bld 100 (*)    Calcium 8.5 (*)    All other components within normal limits  TROPONIN I (HIGH SENSITIVITY)  TROPONIN I (HIGH SENSITIVITY)    DG Chest Port 1 View  Final Result    DG Shoulder Left  Final Result      Medications  lidocaine  (LIDODERM ) 5 % 1 patch (1 patch Transdermal Patch Applied 03/31/24 0247)  HYDROmorphone  (DILAUDID ) injection 0.5 mg (0.5 mg Intravenous Given 03/31/24 0137)  ondansetron  (ZOFRAN ) injection 4 mg (4 mg Intravenous Given 03/31/24 0137)  ketorolac  (TORADOL ) 30 MG/ML injection 30 mg (30 mg Intravenous Given 03/31/24 0248)     Procedures  /  Critical Care Procedures  ED Course and Medical Decision Making  I have reviewed the triage vital signs, the nursing notes, and pertinent available records from the EMR.  Social Determinants Affecting Complexity of Care: Patient has no clinically significant social determinants affecting this chief complaint..   ED Course:    Medical Decision Making Patient here with left shoulder and back pain.  Pain seems to be musculoskeletal.  It is easily reproducible with palpation.  Suspect muscle spasm.  However, given  location, will check for potential anginal equivalent with getting troponins, EKG, chest x-ray.  Labs and workup are reassuring.  Nonischemic EKG.  Troponins are negative.  Doubt ACS.  Pain remains reproducible, but she has had significant improvement with treatment in the ED.  She was treated with IV Dilaudid , Toradol , and Lidoderm .  Will plan for discharge home with muscle relaxer and NSAIDs.  Recommend supportive care measures such as massage therapy, ice, heat, gentle stretching.  Amount and/or Complexity of Data Reviewed Labs: ordered. Radiology: ordered. ECG/medicine tests:  ordered.  Risk Prescription drug management.         Consultants: No consultations were needed in caring for this patient.   Treatment and Plan: Emergency department workup does not suggest an emergent condition requiring admission or immediate intervention beyond  what has been performed at this time. The patient is safe for discharge and has  been instructed to return immediately for worsening symptoms, change in  symptoms or any other concerns    Final Clinical Impressions(s) / ED Diagnoses     ICD-10-CM   1. Muscle spasms of neck  M62.838     2. Torticollis, acute  M43.6       ED Discharge Orders          Ordered    cyclobenzaprine  (FLEXERIL ) 10 MG tablet  3 times daily PRN        03/31/24 0353    ibuprofen  (ADVIL ) 800 MG tablet  3 times daily        03/31/24 0353    lidocaine  (LIDODERM ) 5 %  Every 24 hours        03/31/24 0353              Discharge Instructions Discussed with and Provided to Patient:   Discharge Instructions   None      Vicky Charleston, PA-C 03/31/24 0357    Midge Golas, MD 03/31/24 [phone]
